# Patient Record
Sex: Female | Born: 1985
Health system: Southern US, Community
[De-identification: ages and names within clinical notes are randomized; demographics above are authoritative.]

## PROBLEM LIST (undated history)

## (undated) ENCOUNTER — Inpatient Hospital Stay (HOSPITAL_COMMUNITY): Payer: Self-pay

## (undated) DIAGNOSIS — Z789 Other specified health status: Secondary | ICD-10-CM

## (undated) DIAGNOSIS — D649 Anemia, unspecified: Secondary | ICD-10-CM

## (undated) DIAGNOSIS — B029 Zoster without complications: Secondary | ICD-10-CM

## (undated) DIAGNOSIS — Z8619 Personal history of other infectious and parasitic diseases: Secondary | ICD-10-CM

## (undated) HISTORY — DX: Anemia, unspecified: D64.9

## (undated) HISTORY — PX: NO PAST SURGERIES: SHX2092

## (undated) HISTORY — PX: UMBILICAL HERNIA REPAIR: SHX196

## (undated) HISTORY — DX: Personal history of other infectious and parasitic diseases: Z86.19

---

## 2004-05-28 ENCOUNTER — Emergency Department (HOSPITAL_COMMUNITY): Admission: EM | Admit: 2004-05-28 | Discharge: 2004-05-29 | Payer: Self-pay | Admitting: *Deleted

## 2005-12-13 ENCOUNTER — Emergency Department (HOSPITAL_COMMUNITY): Admission: EM | Admit: 2005-12-13 | Discharge: 2005-12-13 | Payer: Self-pay | Admitting: Family Medicine

## 2007-03-26 ENCOUNTER — Emergency Department (HOSPITAL_COMMUNITY): Admission: EM | Admit: 2007-03-26 | Discharge: 2007-03-26 | Payer: Self-pay | Admitting: Family Medicine

## 2010-10-07 LAB — HIV ANTIBODY (ROUTINE TESTING W REFLEX): HIV: NONREACTIVE

## 2010-10-07 LAB — HEPATITIS B SURFACE ANTIGEN: Hepatitis B Surface Ag: NEGATIVE

## 2010-10-07 LAB — RPR
RPR: NONREACTIVE
RPR: NONREACTIVE
RPR: NONREACTIVE

## 2010-10-07 LAB — RUBELLA ANTIBODY, IGM: Rubella: IMMUNE

## 2010-10-07 LAB — ABO/RH: RH Type: POSITIVE

## 2010-10-07 LAB — ANTIBODY SCREEN: Antibody Screen: NEGATIVE

## 2010-10-20 LAB — GC/CHLAMYDIA PROBE AMP, GENITAL
Chlamydia: NEGATIVE
Chlamydia: NEGATIVE
Chlamydia: NEGATIVE
Gonorrhea: NEGATIVE
Gonorrhea: NEGATIVE
Gonorrhea: NEGATIVE

## 2011-03-23 ENCOUNTER — Inpatient Hospital Stay (HOSPITAL_COMMUNITY): Admit: 2011-03-23 | Payer: Self-pay | Admitting: Obstetrics and Gynecology

## 2011-04-17 LAB — STREP B DNA PROBE: GBS: NEGATIVE

## 2011-05-05 ENCOUNTER — Inpatient Hospital Stay (HOSPITAL_COMMUNITY)
Admission: AD | Admit: 2011-05-05 | Discharge: 2011-05-09 | DRG: 775 | Disposition: A | Discharge status: Home / Self Care | Payer: 59 | Source: Ambulatory Visit | Attending: Obstetrics and Gynecology | Admitting: Obstetrics and Gynecology

## 2011-05-05 ENCOUNTER — Encounter (HOSPITAL_COMMUNITY): Payer: Self-pay | Admitting: *Deleted

## 2011-05-05 HISTORY — DX: Other specified health status: Z78.9

## 2011-05-05 LAB — CBC
HCT: 32.7 % — ABNORMAL LOW (ref 36.0–46.0)
Hemoglobin: 11 g/dL — ABNORMAL LOW (ref 12.0–15.0)
MCH: 30.2 pg (ref 26.0–34.0)
MCHC: 33.6 g/dL (ref 30.0–36.0)
MCV: 89.8 fL (ref 78.0–100.0)
Platelets: 264 10*3/uL (ref 150–400)
RBC: 3.64 MIL/uL — ABNORMAL LOW (ref 3.87–5.11)
RDW: 13.2 % (ref 11.5–15.5)
WBC: 12 10*3/uL — ABNORMAL HIGH (ref 4.0–10.5)

## 2011-05-05 LAB — RPR: RPR Ser Ql: NONREACTIVE

## 2011-05-05 MED ORDER — SODIUM CHLORIDE 0.9 % IV SOLN
2.0000 g | Freq: Four times a day (QID) | INTRAVENOUS | Status: DC
  Administered 2011-05-05 – 2011-05-06 (×5): 2 g via INTRAVENOUS
  Filled 2011-05-05 (×7): qty 2000

## 2011-05-05 MED ORDER — DIPHENHYDRAMINE HCL 50 MG/ML IJ SOLN: 12.5000 mg | INTRAMUSCULAR | Status: DC | PRN

## 2011-05-05 MED ORDER — LIDOCAINE HCL (PF) 1 % IJ SOLN
30.0000 mL | INTRAMUSCULAR | Status: DC | PRN
Start: 2011-05-05 — End: 2011-05-07
  Administered 2011-05-07: 30 mL via SUBCUTANEOUS
  Filled 2011-05-05: qty 30

## 2011-05-05 MED ORDER — PHENYLEPHRINE 40 MCG/ML (10ML) SYRINGE FOR IV PUSH (FOR BLOOD PRESSURE SUPPORT)
80.0000 ug | PREFILLED_SYRINGE | INTRAVENOUS | Status: DC | PRN
  Filled 2011-05-05: qty 5

## 2011-05-05 MED ORDER — OXYTOCIN BOLUS FROM INFUSION
500.0000 mL | Freq: Once | INTRAVENOUS | Status: DC
  Filled 2011-05-05: qty 1000
  Filled 2011-05-05: qty 500

## 2011-05-05 MED ORDER — ZOLPIDEM TARTRATE 10 MG PO TABS
10.0000 mg | ORAL_TABLET | Freq: Every evening | ORAL | Status: DC | PRN
  Administered 2011-05-05: 10 mg via ORAL
  Filled 2011-05-05: qty 1

## 2011-05-05 MED ORDER — EPHEDRINE 5 MG/ML INJ
10.0000 mg | INTRAVENOUS | Status: DC | PRN
  Filled 2011-05-05: qty 4

## 2011-05-05 MED ORDER — LACTATED RINGERS IV SOLN
INTRAVENOUS | Status: DC
  Administered 2011-05-05 (×2): via INTRAVENOUS

## 2011-05-05 MED ORDER — OXYTOCIN 20 UNITS IN LACTATED RINGERS INFUSION - SIMPLE
1.0000 m[IU]/min | INTRAVENOUS | Status: DC
  Administered 2011-05-05: 1 m[IU]/min via INTRAVENOUS

## 2011-05-05 MED ORDER — OXYCODONE-ACETAMINOPHEN 5-325 MG PO TABS: 1.0000 | ORAL_TABLET | ORAL | Status: DC | PRN

## 2011-05-05 MED ORDER — PHENYLEPHRINE 40 MCG/ML (10ML) SYRINGE FOR IV PUSH (FOR BLOOD PRESSURE SUPPORT): 80.0000 ug | PREFILLED_SYRINGE | INTRAVENOUS | Status: DC | PRN

## 2011-05-05 MED ORDER — OXYTOCIN 20 UNITS IN LACTATED RINGERS INFUSION - SIMPLE: 125.0000 mL/h | Freq: Once | INTRAVENOUS | Status: DC

## 2011-05-05 MED ORDER — ONDANSETRON HCL 4 MG/2ML IJ SOLN: 4.0000 mg | Freq: Four times a day (QID) | INTRAMUSCULAR | Status: DC | PRN

## 2011-05-05 MED ORDER — LACTATED RINGERS IV SOLN: 500.0000 mL | INTRAVENOUS | Status: DC | PRN

## 2011-05-05 MED ORDER — EPHEDRINE 5 MG/ML INJ: 10.0000 mg | INTRAVENOUS | Status: DC | PRN

## 2011-05-05 MED ORDER — TERBUTALINE SULFATE 1 MG/ML IJ SOLN: 0.2500 mg | Freq: Once | INTRAMUSCULAR | Status: AC | PRN

## 2011-05-05 MED ORDER — ACETAMINOPHEN 325 MG PO TABS: 650.0000 mg | ORAL_TABLET | ORAL | Status: DC | PRN

## 2011-05-05 MED ORDER — IBUPROFEN 600 MG PO TABS: 600.0000 mg | ORAL_TABLET | Freq: Four times a day (QID) | ORAL | Status: DC | PRN

## 2011-05-05 MED ORDER — LACTATED RINGERS IV SOLN
500.0000 mL | Freq: Once | INTRAVENOUS | Status: AC
  Administered 2011-05-06: 500 mL via INTRAVENOUS

## 2011-05-05 MED ORDER — FENTANYL 2.5 MCG/ML BUPIVACAINE 1/10 % EPIDURAL INFUSION (WH - ANES)
14.0000 mL/h | INTRAMUSCULAR | Status: DC
  Administered 2011-05-06 (×2): 14 mL/h via EPIDURAL
  Filled 2011-05-05 (×3): qty 60

## 2011-05-05 MED ORDER — CITRIC ACID-SODIUM CITRATE 334-500 MG/5ML PO SOLN: 30.0000 mL | ORAL | Status: DC | PRN

## 2011-05-05 MED ORDER — FLEET ENEMA 7-19 GM/118ML RE ENEM: 1.0000 | ENEMA | RECTAL | Status: DC | PRN

## 2011-05-05 NOTE — H&P (Signed)
Anita Wiggins is a 26 y.o. female presenting for admission due to SROM bloody fluid in office this morning after exam.  Nitrazine postive and gush of fluid noted.Marland Kitchen History OB History    Grav Para Term Preterm Abortions TAB SAB Ect Mult Living   1              No past medical history on file. History reviewed. No pertinent past surgical history. Family History: family history is not on file. Social History:  does not have a smoking history on file. She does not have any smokeless tobacco history on file. She reports that she does not drink alcohol. Her drug history not on file.  ROS    Blood pressure 133/73, pulse 92, temperature 98.5 F (36.9 C), temperature source Oral, resp. rate 18, height 5\' 8"  (1.727 m), weight 74.844 kg (165 lb). Exam Physical Exam  2/50/-2 Cervix posterior Nitrazine positve, pool positive Prenatal labs: ABO, Rh: O/Positive/-- (07/17 0000) Antibody: Negative (07/17 0000) Rubella: Immune (07/17 0000) RPR: Nonreactive, Nonreactive, Nonreactive (07/17 0000)  HBsAg: Negative (07/17 0000)  HIV: Non-reactive (07/17 0000)  GBS: Negative (01/25 0000)   Assessment/Plan: IUP at term with leaking (SROM) Plan admission with augmentation of labor as needed. Anticipate SVD   Loria Lacina C 05/05/2011, 8:31 PM

## 2011-05-06 ENCOUNTER — Encounter (HOSPITAL_COMMUNITY): Payer: Self-pay | Admitting: *Deleted

## 2011-05-06 ENCOUNTER — Encounter (HOSPITAL_COMMUNITY): Payer: Self-pay | Admitting: Anesthesiology

## 2011-05-06 ENCOUNTER — Inpatient Hospital Stay (HOSPITAL_COMMUNITY): Payer: 59 | Admitting: Anesthesiology

## 2011-05-06 MED ORDER — OXYTOCIN 20 UNITS IN LACTATED RINGERS INFUSION - SIMPLE
1.0000 m[IU]/min | INTRAVENOUS | Status: DC
  Administered 2011-05-06: 3 m[IU]/min via INTRAVENOUS

## 2011-05-06 MED ORDER — BUTORPHANOL TARTRATE 2 MG/ML IJ SOLN
1.0000 mg | INTRAMUSCULAR | Status: DC | PRN
  Administered 2011-05-06: 1 mg via INTRAVENOUS
  Administered 2011-05-06: 14:00:00 via INTRAVENOUS
  Filled 2011-05-06 (×2): qty 1

## 2011-05-06 MED ORDER — LIDOCAINE HCL 1.5 % IJ SOLN
INTRAMUSCULAR | Status: DC | PRN
  Administered 2011-05-06 (×2): 5 mL via EPIDURAL

## 2011-05-06 MED ORDER — TERBUTALINE SULFATE 1 MG/ML IJ SOLN: 0.2500 mg | Freq: Once | INTRAMUSCULAR | Status: AC | PRN

## 2011-05-06 MED ORDER — FENTANYL 2.5 MCG/ML BUPIVACAINE 1/10 % EPIDURAL INFUSION (WH - ANES)
INTRAMUSCULAR | Status: DC | PRN
  Administered 2011-05-06: 14 mL/h via EPIDURAL

## 2011-05-06 NOTE — Progress Notes (Signed)
UCs were q1-2 min after AROM Pitocin then discontinued Now UCs about q2-3 min FHT remained reactive Will restart pitocin if UCs space out and no cervical change Epidural prn

## 2011-05-06 NOTE — Progress Notes (Signed)
UR chart review completed.  

## 2011-05-06 NOTE — Anesthesia Preprocedure Evaluation (Signed)

## 2011-05-06 NOTE — Anesthesia Procedure Notes (Signed)
Epidural Patient location during procedure: OB Start time: 05/06/2011 3:29 PM End time: 05/06/2011 3:35 PM Reason for block: procedure for pain  Staffing Anesthesiologist: Sandrea Hughs Performed by: anesthesiologist   Preanesthetic Checklist Completed: patient identified, site marked, surgical consent, pre-op evaluation, timeout performed, IV checked, risks and benefits discussed and monitors and equipment checked  Epidural Patient position: sitting Prep: site prepped and draped and DuraPrep Patient monitoring: continuous pulse ox and blood pressure Approach: midline Injection technique: LOR air  Needle:  Needle type: Tuohy  Needle gauge: 17 G Needle length: 9 cm Needle insertion depth: 5 cm cm Catheter type: closed end flexible Catheter size: 19 Gauge Catheter at skin depth: 10 cm Test dose: negative and 1.5% lidocaine  Assessment Sensory level: T8 Events: blood not aspirated, injection not painful, no injection resistance, negative IV test and no paresthesia

## 2011-05-06 NOTE — Progress Notes (Signed)
Cx no change FHT reactive UCs q 3-3.5 min, 45-50  D/W pt, will optimize labor with pitocin, follow labor pattern and cervix closely

## 2011-05-06 NOTE — Progress Notes (Signed)
Epidural in cx  4/c/-1 FHT reactive, some variable decels uc's about q 3-4 min IUPC placed

## 2011-05-06 NOTE — Progress Notes (Signed)
UCs q2-3 min FHT reactive Cx 2/C/O Stadol/epidural prn, D/W pt

## 2011-05-06 NOTE — Progress Notes (Signed)
Cx C/C +1/LOT FHT reactive UCs q 2-3 min Begin second stage

## 2011-05-06 NOTE — Progress Notes (Signed)
Starting to feel strong UCs FHT reactive, + accels UCs q 2-3 min Cx 2/80/-2/vtx AROM of forebag , clear Pit on  Epidural prn

## 2011-05-06 NOTE — Progress Notes (Signed)
Cx 7.5 (8 with UC)/C/0 FHT reactive  Temp 100 po, now 99.3

## 2011-05-07 ENCOUNTER — Encounter (HOSPITAL_COMMUNITY): Payer: Self-pay | Admitting: *Deleted

## 2011-05-07 ENCOUNTER — Other Ambulatory Visit: Payer: Self-pay | Admitting: Obstetrics and Gynecology

## 2011-05-07 MED ORDER — BENZOCAINE-MENTHOL 20-0.5 % EX AERO
1.0000 "application " | INHALATION_SPRAY | CUTANEOUS | Status: DC | PRN
  Filled 2011-05-07: qty 56

## 2011-05-07 MED ORDER — BENZOCAINE-MENTHOL 20-0.5 % EX AERO
INHALATION_SPRAY | CUTANEOUS | Status: AC
  Filled 2011-05-07: qty 56

## 2011-05-07 MED ORDER — DIPHENHYDRAMINE HCL 25 MG PO CAPS: 25.0000 mg | ORAL_CAPSULE | Freq: Four times a day (QID) | ORAL | Status: DC | PRN

## 2011-05-07 MED ORDER — OXYCODONE-ACETAMINOPHEN 5-325 MG PO TABS
1.0000 | ORAL_TABLET | ORAL | Status: DC | PRN
  Administered 2011-05-07 (×4): 1 via ORAL
  Filled 2011-05-07 (×4): qty 1

## 2011-05-07 MED ORDER — SIMETHICONE 80 MG PO CHEW: 80.0000 mg | CHEWABLE_TABLET | ORAL | Status: DC | PRN

## 2011-05-07 MED ORDER — ZOLPIDEM TARTRATE 5 MG PO TABS: 5.0000 mg | ORAL_TABLET | Freq: Every evening | ORAL | Status: DC | PRN

## 2011-05-07 MED ORDER — ONDANSETRON HCL 4 MG PO TABS: 4.0000 mg | ORAL_TABLET | ORAL | Status: DC | PRN

## 2011-05-07 MED ORDER — BISACODYL 10 MG RE SUPP
10.0000 mg | Freq: Every day | RECTAL | Status: DC | PRN
  Filled 2011-05-07: qty 1

## 2011-05-07 MED ORDER — TETANUS-DIPHTH-ACELL PERTUSSIS 5-2.5-18.5 LF-MCG/0.5 IM SUSP: 0.5000 mL | Freq: Once | INTRAMUSCULAR | Status: DC

## 2011-05-07 MED ORDER — IBUPROFEN 600 MG PO TABS
600.0000 mg | ORAL_TABLET | Freq: Four times a day (QID) | ORAL | Status: DC
  Administered 2011-05-07 – 2011-05-09 (×9): 600 mg via ORAL
  Filled 2011-05-07 (×10): qty 1

## 2011-05-07 MED ORDER — DIBUCAINE 1 % RE OINT
1.0000 "application " | TOPICAL_OINTMENT | RECTAL | Status: DC | PRN
  Filled 2011-05-07: qty 28

## 2011-05-07 MED ORDER — LANOLIN HYDROUS EX OINT: TOPICAL_OINTMENT | CUTANEOUS | Status: DC | PRN

## 2011-05-07 MED ORDER — CEFAZOLIN SODIUM 1-5 GM-% IV SOLN
1.0000 g | Freq: Four times a day (QID) | INTRAVENOUS | Status: AC
  Administered 2011-05-07 (×2): 1 g via INTRAVENOUS
  Filled 2011-05-07 (×2): qty 50

## 2011-05-07 MED ORDER — ONDANSETRON HCL 4 MG/2ML IJ SOLN: 4.0000 mg | INTRAMUSCULAR | Status: DC | PRN

## 2011-05-07 MED ORDER — SENNOSIDES-DOCUSATE SODIUM 8.6-50 MG PO TABS
2.0000 | ORAL_TABLET | Freq: Every day | ORAL | Status: DC
  Administered 2011-05-07: 2 via ORAL

## 2011-05-07 MED ORDER — PRENATAL MULTIVITAMIN CH
1.0000 | ORAL_TABLET | Freq: Every day | ORAL | Status: DC
  Administered 2011-05-07 – 2011-05-09 (×3): 1 via ORAL
  Filled 2011-05-07 (×3): qty 1

## 2011-05-07 MED ORDER — FLEET ENEMA 7-19 GM/118ML RE ENEM: 1.0000 | ENEMA | Freq: Every day | RECTAL | Status: DC | PRN

## 2011-05-07 MED ORDER — WITCH HAZEL-GLYCERIN EX PADS: 1.0000 "application " | MEDICATED_PAD | CUTANEOUS | Status: DC | PRN

## 2011-05-07 NOTE — Anesthesia Postprocedure Evaluation (Signed)
  Anesthesia Post-op Note  Patient: Anita Wiggins  Procedure(s) Performed: * No procedures listed *  Patient Location: Mother/Baby  Anesthesia Type: Epidural  Level of Consciousness: awake  Airway and Oxygen Therapy: Patient Spontanous Breathing  Post-op Pain: none  Post-op Assessment: Patient's Cardiovascular Status Stable, Respiratory Function Stable, No signs of Nausea or vomiting, Adequate PO intake and Pain level controlled  Post-op Vital Signs: Reviewed and stable  Complications: No apparent anesthesia complications

## 2011-05-07 NOTE — Progress Notes (Signed)
Post Partum Day 0 Subjective: up ad lib, voiding and tolerating PO  Objective: Blood pressure 104/59, pulse 102, temperature 98.9 F (37.2 C), temperature source Axillary, resp. rate 18, height 5\' 8"  (1.727 m), weight 74.844 kg (165 lb), SpO2 97.00%, unknown if currently breastfeeding.  Physical Exam:  General: alert and cooperative Lochia: appropriate Uterine Fundus: firm Perineum intact, labial edema noted DVT Evaluation: No evidence of DVT seen on physical exam.   Basename 05/05/11 1145  HGB 11.0*  HCT 32.7*    Assessment/Plan: Plan for discharge tomorrow Continue ice pack to perineum   LOS: 2 days   Brit Wernette G 05/07/2011, 8:02 AM

## 2011-05-07 NOTE — Anesthesia Postprocedure Evaluation (Signed)
Anesthesia Post Note  Patient: Anita Wiggins  Procedure(s) Performed: * No procedures listed *  Anesthesia type: Epidural  Patient location: Mother/Baby  Post pain: Pain level controlled  Post assessment: Post-op Vital signs reviewed  Last Vitals:  Filed Vitals:   05/07/11 0440  BP: 104/59  Pulse: 102  Temp: 37.2 C  Resp: 18    Post vital signs: Reviewed  Level of consciousness: awake  Complications: No apparent anesthesia complications

## 2011-05-07 NOTE — Progress Notes (Signed)
Delivery Note Pushing x 3 hours, vtx at +3, pt exhausted D/W pt/husband options, d/w VE and risks of severe morbidity/mortality Straight cath of bladder Kiwi VE applied, steady progress over about 4 UCs, one pop off, LOA, asynclytic Second degree MLE done secondary to tight perineum, repaired Placenta 3 vessels, intact, EBL 350cc Cx/vagina intact VMI  Apgars 9/9, weight 7#  3oz, art pH 7.23 Pt/infant stable in LDR

## 2011-05-07 NOTE — Addendum Note (Signed)
Addendum  created 05/07/11 1006 by Suella Grove, CRNA   Modules edited:Charges VN, Notes Section

## 2011-05-07 NOTE — Addendum Note (Signed)
Addendum  created 05/07/11 1006 by Jafari Mckillop C Kimsey Demaree, CRNA   Modules edited:Charges VN, Notes Section    

## 2011-05-08 LAB — CBC
HCT: 26.7 % — ABNORMAL LOW (ref 36.0–46.0)
Hemoglobin: 8.7 g/dL — ABNORMAL LOW (ref 12.0–15.0)
MCH: 29.6 pg (ref 26.0–34.0)
MCHC: 32.6 g/dL (ref 30.0–36.0)
MCV: 90.8 fL (ref 78.0–100.0)
Platelets: 207 10*3/uL (ref 150–400)
RBC: 2.94 MIL/uL — ABNORMAL LOW (ref 3.87–5.11)
RDW: 13.6 % (ref 11.5–15.5)
WBC: 12.8 10*3/uL — ABNORMAL HIGH (ref 4.0–10.5)

## 2011-05-08 MED ORDER — IBUPROFEN 600 MG PO TABS: 600.0000 mg | ORAL_TABLET | Freq: Four times a day (QID) | ORAL | Status: AC

## 2011-05-08 MED ORDER — OXYCODONE-ACETAMINOPHEN 5-325 MG PO TABS: 1.0000 | ORAL_TABLET | ORAL | Status: AC | PRN

## 2011-05-08 NOTE — Progress Notes (Signed)
Post Partum Day 1 Subjective: no complaints, up ad lib, voiding, tolerating PO and + flatus  Objective: Blood pressure 135/83, pulse 82, temperature 98 F (36.7 C), temperature source Oral, resp. rate 18, height 5\' 8"  (1.727 m), weight 74.844 kg (165 lb), SpO2 97.00%, unknown if currently breastfeeding.  Physical Exam:  General: alert and cooperative Lochia: appropriate Uterine Fundus: firm Perineum intact DVT Evaluation: No evidence of DVT seen on physical exam.   Basename 05/08/11 0558 05/05/11 1145  HGB 8.7* 11.0*  HCT 26.7* 32.7*    Assessment/Plan: Discharge home   LOS: 3 days   Travonna Swindle G 05/08/2011, 8:00 AM

## 2011-05-08 NOTE — Progress Notes (Signed)
Patient wanted an early discharge.  Discharged cancelled due to baby not being discharged by pediatrician.  Prescriptions for ibuprofin and oxycodone given to patient

## 2011-05-09 NOTE — Progress Notes (Signed)
Post Partum Day 2 Subjective: no complaints  Objective: Blood pressure 112/67, pulse 81, temperature 98.5 F (36.9 C), temperature source Oral, resp. rate 18, height 5\' 8"  (1.727 m), weight 165 lb (74.844 kg), SpO2 97.00%, unknown if currently breastfeeding.  Physical Exam:  General: alert Lochia: appropriate Uterine Fundus: firm Incision: healing well DVT Evaluation: No evidence of DVT seen on physical exam.   Basename 05/08/11 0558  HGB 8.7*  HCT 26.7*    Assessment/Plan: Discharge home   LOS: 4 days   Anita Wiggins M 05/09/2011, 9:27 AM

## 2011-06-17 ENCOUNTER — Other Ambulatory Visit: Payer: Self-pay | Admitting: Obstetrics and Gynecology

## 2012-07-01 ENCOUNTER — Other Ambulatory Visit: Payer: Self-pay | Admitting: Obstetrics and Gynecology

## 2013-07-11 ENCOUNTER — Encounter (HOSPITAL_COMMUNITY): Payer: Self-pay

## 2013-07-11 ENCOUNTER — Inpatient Hospital Stay (HOSPITAL_COMMUNITY)
Admission: AD | Admit: 2013-07-11 | Discharge: 2013-07-11 | Disposition: A | Discharge status: Home / Self Care | Payer: 59 | Source: Ambulatory Visit | Attending: Obstetrics and Gynecology | Admitting: Obstetrics and Gynecology

## 2013-07-11 DIAGNOSIS — O00109 Unspecified tubal pregnancy without intrauterine pregnancy: Secondary | ICD-10-CM | POA: Insufficient documentation

## 2013-07-11 HISTORY — DX: Zoster without complications: B02.9

## 2013-07-11 LAB — CBC WITH DIFFERENTIAL/PLATELET
Basophils Absolute: 0.1 10*3/uL (ref 0.0–0.1)
Basophils Relative: 0 % (ref 0–1)
Eosinophils Absolute: 0.2 10*3/uL (ref 0.0–0.7)
Eosinophils Relative: 2 % (ref 0–5)
HCT: 39 % (ref 36.0–46.0)
Hemoglobin: 13.4 g/dL (ref 12.0–15.0)
Lymphocytes Relative: 13 % (ref 12–46)
Lymphs Abs: 1.7 10*3/uL (ref 0.7–4.0)
MCH: 30 pg (ref 26.0–34.0)
MCHC: 34.4 g/dL (ref 30.0–36.0)
MCV: 87.4 fL (ref 78.0–100.0)
Monocytes Absolute: 0.8 10*3/uL (ref 0.1–1.0)
Monocytes Relative: 6 % (ref 3–12)
Neutro Abs: 10.7 10*3/uL — ABNORMAL HIGH (ref 1.7–7.7)
Neutrophils Relative %: 79 % — ABNORMAL HIGH (ref 43–77)
Platelets: 276 10*3/uL (ref 150–400)
RBC: 4.46 MIL/uL (ref 3.87–5.11)
RDW: 13.3 % (ref 11.5–15.5)
WBC: 13.5 10*3/uL — ABNORMAL HIGH (ref 4.0–10.5)

## 2013-07-11 LAB — AST: AST: 12 U/L (ref 0–37)

## 2013-07-11 LAB — CREATININE, SERUM
Creatinine, Ser: 0.54 mg/dL (ref 0.50–1.10)
GFR calc Af Amer: >90 mL/min (ref >90)
GFR calc non Af Amer: >90 mL/min (ref >90)

## 2013-07-11 LAB — BUN: BUN: 6 mg/dL (ref 6–23)

## 2013-07-11 LAB — HCG, QUANTITATIVE, PREGNANCY: hCG, Beta Chain, Quant, S: 6242 m[IU]/mL — ABNORMAL HIGH (ref <5)

## 2013-07-11 MED ORDER — METHOTREXATE INJECTION FOR WOMEN'S HOSPITAL
50.0000 mg/m2 | Freq: Once | INTRAMUSCULAR | Status: AC
  Administered 2013-07-11: 85 mg via INTRAMUSCULAR
  Filled 2013-07-11: qty 1.7

## 2013-07-11 NOTE — Discharge Instructions (Signed)
Keep your scheduled appointment for F/U labs and any other appointments with your physician. Call the MD office or provider on call with any concerns or return to MAU.

## 2013-07-11 NOTE — MAU Note (Signed)
Patient states she has been diagnosed with an ectopic pregnancy and sent from the office for MTX workup and injection. Patient states she is having bleeding and cramping.

## 2013-07-11 NOTE — H&P (Signed)
28 year old G 2 P 2 with right adnexal mass. Quants abnormally rising and today ultrasound revealed 15 mm right adnexal mass.  Complain of some cramping and bleeding  Afebrile VSS Abdomen is benign  IMPRESSION: Right ectopic pregnancy PLAN: Methotrexate injection today Patient counseled about risks of the medication Return to office on Friday for [place] with me

## 2013-07-14 ENCOUNTER — Inpatient Hospital Stay (HOSPITAL_COMMUNITY)
Admission: AD | Admit: 2013-07-14 | Discharge: 2013-07-14 | Disposition: A | Discharge status: Home / Self Care | Payer: 59 | Source: Ambulatory Visit | Attending: Obstetrics and Gynecology | Admitting: Obstetrics and Gynecology

## 2013-07-14 DIAGNOSIS — O00109 Unspecified tubal pregnancy without intrauterine pregnancy: Secondary | ICD-10-CM | POA: Insufficient documentation

## 2013-07-14 MED ORDER — METHOTREXATE INJECTION FOR WOMEN'S HOSPITAL
50.0000 mg/m2 | Freq: Once | INTRAMUSCULAR | Status: AC
  Administered 2013-07-14: 85 mg via INTRAMUSCULAR
  Filled 2013-07-14: qty 1.7

## 2013-07-14 NOTE — MAU Note (Signed)
Went to office for day 4 post MTX.  Was called with results. HCG almost doubled. Sent in for 2nd dose.  Has had occ twinges, continues to bleed, looks like mid cylce bleeding now. Had fleshy like clots on Tues.Wed.

## 2013-07-15 ENCOUNTER — Inpatient Hospital Stay (HOSPITAL_COMMUNITY): Payer: 59

## 2013-07-15 ENCOUNTER — Encounter (HOSPITAL_COMMUNITY): Payer: Self-pay | Admitting: *Deleted

## 2013-07-15 ENCOUNTER — Observation Stay (HOSPITAL_COMMUNITY)
Admission: AD | Admit: 2013-07-15 | Discharge: 2013-07-15 | Disposition: A | Discharge status: Home / Self Care | Payer: 59 | Source: Ambulatory Visit | Attending: Obstetrics and Gynecology | Admitting: Obstetrics and Gynecology

## 2013-07-15 DIAGNOSIS — R1031 Right lower quadrant pain: Secondary | ICD-10-CM | POA: Insufficient documentation

## 2013-07-15 DIAGNOSIS — O00109 Unspecified tubal pregnancy without intrauterine pregnancy: Principal | ICD-10-CM | POA: Insufficient documentation

## 2013-07-15 DIAGNOSIS — O009 Unspecified ectopic pregnancy without intrauterine pregnancy: Secondary | ICD-10-CM | POA: Diagnosis present

## 2013-07-15 LAB — CBC
HCT: 38.2 % (ref 36.0–46.0)
HCT: 34.2 % — ABNORMAL LOW (ref 36.0–46.0)
HCT: 35.6 % — ABNORMAL LOW (ref 36.0–46.0)
Hemoglobin: 13.3 g/dL (ref 12.0–15.0)
Hemoglobin: 11.8 g/dL — ABNORMAL LOW (ref 12.0–15.0)
Hemoglobin: 12.1 g/dL (ref 12.0–15.0)
MCH: 30.3 pg (ref 26.0–34.0)
MCH: 29.9 pg (ref 26.0–34.0)
MCH: 29.5 pg (ref 26.0–34.0)
MCHC: 34.8 g/dL (ref 30.0–36.0)
MCHC: 34.5 g/dL (ref 30.0–36.0)
MCHC: 34 g/dL (ref 30.0–36.0)
MCV: 87 fL (ref 78.0–100.0)
MCV: 86.6 fL (ref 78.0–100.0)
MCV: 86.8 fL (ref 78.0–100.0)
Platelets: 299 10*3/uL (ref 150–400)
Platelets: 256 10*3/uL (ref 150–400)
Platelets: 259 10*3/uL (ref 150–400)
RBC: 4.39 MIL/uL (ref 3.87–5.11)
RBC: 3.95 MIL/uL (ref 3.87–5.11)
RBC: 4.1 MIL/uL (ref 3.87–5.11)
RDW: 12.9 % (ref 11.5–15.5)
RDW: 12.9 % (ref 11.5–15.5)
RDW: 13 % (ref 11.5–15.5)
WBC: 12.9 10*3/uL — ABNORMAL HIGH (ref 4.0–10.5)
WBC: 8.6 10*3/uL (ref 4.0–10.5)
WBC: 8.8 10*3/uL (ref 4.0–10.5)

## 2013-07-15 LAB — HCG, QUANTITATIVE, PREGNANCY: hCG, Beta Chain, Quant, S: 8120 m[IU]/mL — ABNORMAL HIGH (ref <5)

## 2013-07-15 MED ORDER — LACTATED RINGERS IV SOLN
INTRAVENOUS | Status: DC
  Administered 2013-07-15 (×2): via INTRAVENOUS

## 2013-07-15 MED ORDER — ONDANSETRON HCL 4 MG PO TABS: 4.0000 mg | ORAL_TABLET | Freq: Four times a day (QID) | ORAL | Status: DC | PRN

## 2013-07-15 MED ORDER — BISACODYL 10 MG RE SUPP
10.0000 mg | Freq: Every day | RECTAL | Status: DC | PRN
  Administered 2013-07-15: 10 mg via RECTAL
  Filled 2013-07-15: qty 1

## 2013-07-15 MED ORDER — PRENATAL MULTIVITAMIN CH: 1.0000 | ORAL_TABLET | Freq: Every day | ORAL | Status: DC

## 2013-07-15 MED ORDER — ONDANSETRON HCL 4 MG/2ML IJ SOLN: 4.0000 mg | Freq: Four times a day (QID) | INTRAMUSCULAR | Status: DC | PRN

## 2013-07-15 MED ORDER — SIMETHICONE 80 MG PO CHEW
80.0000 mg | CHEWABLE_TABLET | ORAL | Status: AC
  Administered 2013-07-15: 80 mg via ORAL
  Filled 2013-07-15: qty 1

## 2013-07-15 NOTE — Progress Notes (Signed)
Ambulating to BR without difficulty Pain decreased  VSS Afeb abd flat soft, mild RLQ tend without rebound or masses  Results for orders placed during the hospital encounter of 07/15/13 (from the past 24 hour(s))  HCG, QUANTITATIVE, PREGNANCY     Status: Abnormal   Collection Time    07/15/13  2:48 AM      Result Value Ref Range   hCG, Beta Chain, Quant, S 8120 (*) <5 mIU/mL  CBC     Status: Abnormal   Collection Time    07/15/13  4:28 AM      Result Value Ref Range   WBC 12.9 (*) 4.0 - 10.5 K/uL   RBC 4.39  3.87 - 5.11 MIL/uL   Hemoglobin 13.3  12.0 - 15.0 g/dL   HCT 38.2  36.0 - 46.0 %   MCV 87.0  78.0 - 100.0 fL   MCH 30.3  26.0 - 34.0 pg   MCHC 34.8  30.0 - 36.0 g/dL   RDW 12.9  11.5 - 15.5 %   Platelets 299  150 - 400 K/uL  CBC     Status: Abnormal   Collection Time    07/15/13 11:51 AM      Result Value Ref Range   WBC 8.6  4.0 - 10.5 K/uL   RBC 3.95  3.87 - 5.11 MIL/uL   Hemoglobin 11.8 (*) 12.0 - 15.0 g/dL   HCT 34.2 (*) 36.0 - 46.0 %   MCV 86.6  78.0 - 100.0 fL   MCH 29.9  26.0 - 34.0 pg   MCHC 34.5  30.0 - 36.0 g/dL   RDW 12.9  11.5 - 15.5 %   Platelets 256  150 - 400 K/uL    A/P: D/W patient and husband drop in Hgb         Check CBC at 4 pm , if stable consider D/C home

## 2013-07-15 NOTE — Progress Notes (Signed)
Feels same  Vss Afeb  Abd soft, flat  Results for orders placed during the hospital encounter of 07/15/13 (from the past 24 hour(s))  HCG, QUANTITATIVE, PREGNANCY     Status: Abnormal   Collection Time    07/15/13  2:48 AM      Result Value Ref Range   hCG, Beta Chain, Quant, S 8120 (*) <5 mIU/mL  CBC     Status: Abnormal   Collection Time    07/15/13  4:28 AM      Result Value Ref Range   WBC 12.9 (*) 4.0 - 10.5 K/uL   RBC 4.39  3.87 - 5.11 MIL/uL   Hemoglobin 13.3  12.0 - 15.0 g/dL   HCT 38.2  36.0 - 46.0 %   MCV 87.0  78.0 - 100.0 fL   MCH 30.3  26.0 - 34.0 pg   MCHC 34.8  30.0 - 36.0 g/dL   RDW 12.9  11.5 - 15.5 %   Platelets 299  150 - 400 K/uL  CBC     Status: Abnormal   Collection Time    07/15/13 11:51 AM      Result Value Ref Range   WBC 8.6  4.0 - 10.5 K/uL   RBC 3.95  3.87 - 5.11 MIL/uL   Hemoglobin 11.8 (*) 12.0 - 15.0 g/dL   HCT 34.2 (*) 36.0 - 46.0 %   MCV 86.6  78.0 - 100.0 fL   MCH 29.9  26.0 - 34.0 pg   MCHC 34.5  30.0 - 36.0 g/dL   RDW 12.9  11.5 - 15.5 %   Platelets 256  150 - 400 K/uL  CBC     Status: Abnormal   Collection Time    07/15/13  3:51 PM      Result Value Ref Range   WBC 8.8  4.0 - 10.5 K/uL   RBC 4.10  3.87 - 5.11 MIL/uL   Hemoglobin 12.1  12.0 - 15.0 g/dL   HCT 35.6 (*) 36.0 - 46.0 %   MCV 86.8  78.0 - 100.0 fL   MCH 29.5  26.0 - 34.0 pg   MCHC 34.0  30.0 - 36.0 g/dL   RDW 13.0  11.5 - 15.5 %   Platelets 259  150 - 400 K/uL    A: Right ectopic pregnancy with probable expulsion of pregnancy  P: D/C home      Rest, tylenol prn, No NSAIDs      FU office 2 days

## 2013-07-15 NOTE — MAU Provider Note (Signed)
Chief Complaint: Ectopic Pregnancy, Nausea and Abdominal Pain   First Provider Initiated Contact with Patient 07/15/13 0246     SUBJECTIVE HPI: Anita Wiggins is a 28 y.o. G2P1001 at [redacted]w[redacted]d by LMP with diagnosed ectopic pregnancy who presents to maternity admissions reporting increased RLQ pain and rectal pressure which woke her up from sleep. She was seen in office on 4/19 and had quant hcg drawn.  On 4/21 she was sent to MAU by Dr Helane Rima because quant hcg inappropriately rising and 36mm right adnexal mass identified on ultrasound.  She was given a dose of MTX on 4/21.  On 4/24, she followed up in the office for Day 4 labs and quant had inappropriately risen, so second dose of MTX given.  Pt reports light bleeding x5 days with no change today.  She does also report constipation, with no BM in 1 week despite use of Colace and suppositories.  She denies vaginal itching/burning, urinary symptoms, h/a, dizziness, n/v, or fever/chills.     Past Medical History  Diagnosis Date  . No pertinent past medical history   . Shingles    Past Surgical History  Procedure Laterality Date  . No past surgeries    . Umbilical hernia repair     History   Social History  . Marital Status: Married    Spouse Name: N/A    Number of Children: N/A  . Years of Education: N/A   Occupational History  . Not on file.   Social History Main Topics  . Smoking status: Never Smoker   . Smokeless tobacco: Never Used  . Alcohol Use: No  . Drug Use: No  . Sexual Activity: Yes   Other Topics Concern  . Not on file   Social History Narrative  . No narrative on file   No current facility-administered medications on file prior to encounter.   Current Outpatient Prescriptions on File Prior to Encounter  Medication Sig Dispense Refill  . calcium carbonate (TUMS - DOSED IN MG ELEMENTAL CALCIUM) 500 MG chewable tablet Chew 1 tablet by mouth daily as needed. For acid reflux.      . Prenatal Vit-Fe Fumarate-FA (PRENATAL  MULTIVITAMIN) TABS Take 1 tablet by mouth daily.       Allergies  Allergen Reactions  . No Known Drug Allergy     ROS: Pertinent items in HPI  OBJECTIVE Blood pressure 102/74, pulse 75, temperature 98.5 F (36.9 C), resp. rate 18, height 5\' 9"  (1.753 m), weight 60.601 kg (133 lb 9.6 oz), last menstrual period 05/19/2013, unknown if currently breastfeeding. GENERAL: Well-developed, well-nourished female in no acute distress.  HEENT: Normocephalic HEART: normal rate RESP: normal effort ABDOMEN: Soft, tender in RLQ, no rebound tenderness, no guarding EXTREMITIES: Nontender, no edema NEURO: Alert and oriented SPECULUM EXAM: Deferred  LAB RESULTS Quant hcg pending  IMAGING US Ob Comp Less 14 Wks  07/15/2013   CLINICAL DATA:  Right lower quadrant abdominal pain. Known ectopic pregnancy, status post methotrexate x 2.  EXAM: OBSTETRIC <14 WK [place] AND TRANSVAGINAL OB US  TECHNIQUE: Both transabdominal and transvaginal ultrasound examinations were performed for complete evaluation of the gestation as well as the maternal uterus, adnexal regions, and pelvic cul-de-sac. Transvaginal technique was performed to assess early pregnancy.  COMPARISON:  None.  FINDINGS: Intrauterine gestational sac:  None seen.  Yolk sac:  N/A  Embryo:  N/A  Maternal uterus/adnexae: At the right adnexa, note is made of a 3.5 x 2.1 x 2.2 cm mass, adjacent to the  right ovary. Associated color Doppler blood flow is seen. This is compatible with the patient's known ectopic pregnancy. A small amount of complex free fluid is noted about the right adnexa, with associated clot. Findings are compatible with mild hemorrhage status post rupture.  The endometrial echo complex measures 0.7 cm, within normal limits. The uterus is unremarkable in appearance.  The left ovary is unremarkable in appearance, measuring 3.0 x 1.4 x 1.7 cm.  IMPRESSION: 3.5 x 2.1 x 2.2 cm mass adjacent to the right ovary, compatible with the patient's known ectopic  pregnancy. Small amount of complex free fluid about the right adnexa, with associated clot. Findings compatible with mild hemorrhage status post rupture.  Critical Value/emergent results were called by telephone at the time of interpretation on 07/15/2013 at 3:54 AM to Dr. Lattie Haw LEFTWICH-KIRBY, who verbally acknowledged these results.   Electronically Signed   By: Garald Balding M.D.   On: 07/15/2013 03:54   US Ob Transvaginal  07/15/2013   CLINICAL DATA:  Right lower quadrant abdominal pain. Known ectopic pregnancy, status post methotrexate x 2.  EXAM: OBSTETRIC <14 WK [place] AND TRANSVAGINAL OB US  TECHNIQUE: Both transabdominal and transvaginal ultrasound examinations were performed for complete evaluation of the gestation as well as the maternal uterus, adnexal regions, and pelvic cul-de-sac. Transvaginal technique was performed to assess early pregnancy.  COMPARISON:  None.  FINDINGS: Intrauterine gestational sac:  None seen.  Yolk sac:  N/A  Embryo:  N/A  Maternal uterus/adnexae: At the right adnexa, note is made of a 3.5 x 2.1 x 2.2 cm mass, adjacent to the right ovary. Associated color Doppler blood flow is seen. This is compatible with the patient's known ectopic pregnancy. A small amount of complex free fluid is noted about the right adnexa, with associated clot. Findings are compatible with mild hemorrhage status post rupture.  The endometrial echo complex measures 0.7 cm, within normal limits. The uterus is unremarkable in appearance.  The left ovary is unremarkable in appearance, measuring 3.0 x 1.4 x 1.7 cm.  IMPRESSION: 3.5 x 2.1 x 2.2 cm mass adjacent to the right ovary, compatible with the patient's known ectopic pregnancy. Small amount of complex free fluid about the right adnexa, with associated clot. Findings compatible with mild hemorrhage status post rupture.  Critical Value/emergent results were called by telephone at the time of interpretation on 07/15/2013 at 3:54 AM to Dr. Lattie Haw LEFTWICH-KIRBY,  who verbally acknowledged these results.   Electronically Signed   By: Garald Balding M.D.   On: 07/15/2013 03:54    ASSESSMENT 1. Ectopic pregnancy     PLAN Consult Dr Wynona Meals at 168ml/hour [place] in MAU to evaluate pt     Medication List    ASK your doctor about these medications       calcium carbonate 500 MG chewable tablet  Commonly known as:  TUMS - dosed in mg elemental calcium  Chew 1 tablet by mouth daily as needed. For acid reflux.     prenatal multivitamin Tabs tablet  Take 1 tablet by mouth daily.         Fatima Blank Certified Nurse-Midwife 07/15/2013  4:03 AM

## 2013-07-15 NOTE — MAU Note (Signed)
Had 2nd MTX Friday for ectopic. No BM in a week. Used suppository with no results. Having rectal pressure and increased pain RLQ. I've had vag bleeding since Sunday

## 2013-07-15 NOTE — Discharge Summary (Signed)
Physician Discharge Summary  Patient ID: BRYTTNEY NETZER MRN: [id number] DOB/AGE: 08-11-1985 28 y.o.  Admit date: 07/15/2013 Discharge date: 07/15/2013  Admission Diagnoses:  Discharge Diagnoses:  Active Problems:   Ectopic pregnancy   Discharged Condition: good  Hospital Course: Admitted with RLQ pain after MTX x 2 for right ectopic pregnancy. Observation and IV fluids with NPO. Initial Hgb stable. FU value midday dropped about 1.5 gms. However, third value was stable. Exam stable with non surgical abdomen and stable vital signs.  Consults: None  Significant Diagnostic Studies: labs:  Results for orders placed during the hospital encounter of 07/15/13 (from the past 24 hour(s))  HCG, QUANTITATIVE, PREGNANCY     Status: Abnormal   Collection Time    07/15/13  2:48 AM      Result Value Ref Range   hCG, Beta Chain, Quant, S 8120 (*) <5 mIU/mL  CBC     Status: Abnormal   Collection Time    07/15/13  4:28 AM      Result Value Ref Range   WBC 12.9 (*) 4.0 - 10.5 K/uL   RBC 4.39  3.87 - 5.11 MIL/uL   Hemoglobin 13.3  12.0 - 15.0 g/dL   HCT 38.2  36.0 - 46.0 %   MCV 87.0  78.0 - 100.0 fL   MCH 30.3  26.0 - 34.0 pg   MCHC 34.8  30.0 - 36.0 g/dL   RDW 12.9  11.5 - 15.5 %   Platelets 299  150 - 400 K/uL  CBC     Status: Abnormal   Collection Time    07/15/13 11:51 AM      Result Value Ref Range   WBC 8.6  4.0 - 10.5 K/uL   RBC 3.95  3.87 - 5.11 MIL/uL   Hemoglobin 11.8 (*) 12.0 - 15.0 g/dL   HCT 34.2 (*) 36.0 - 46.0 %   MCV 86.6  78.0 - 100.0 fL   MCH 29.9  26.0 - 34.0 pg   MCHC 34.5  30.0 - 36.0 g/dL   RDW 12.9  11.5 - 15.5 %   Platelets 256  150 - 400 K/uL  CBC     Status: Abnormal   Collection Time    07/15/13  3:51 PM      Result Value Ref Range   WBC 8.8  4.0 - 10.5 K/uL   RBC 4.10  3.87 - 5.11 MIL/uL   Hemoglobin 12.1  12.0 - 15.0 g/dL   HCT 35.6 (*) 36.0 - 46.0 %   MCV 86.8  78.0 - 100.0 fL   MCH 29.5  26.0 - 34.0 pg   MCHC 34.0  30.0 - 36.0 g/dL   RDW 13.0   11.5 - 15.5 %   Platelets 259  150 - 400 K/uL    Treatments: IV hydration  Discharge Exam: Blood pressure 101/69, pulse 78, temperature 98.3 F (36.8 C), temperature source Oral, resp. rate 18, height 5\' 9"  (1.753 m), weight 60.601 kg (133 lb 9.6 oz), last menstrual period 05/19/2013, SpO2 98.00%, unknown if currently breastfeeding. General appearance: alert, cooperative and no distress GI: soft, non-tender; bowel sounds normal; no masses,  no organomegaly  Disposition: 01-Home or Self Care     Medication List    STOP taking these medications       prenatal multivitamin Tabs tablet     Progesterone 50 MG Supp           Follow-up Information   Call in 2 days to follow  up.      Signed: Shon Millet II 07/15/2013, 4:55 PM

## 2013-07-15 NOTE — Progress Notes (Signed)
Discharge instructions provided to patient at bedside.  Activity, medications, follow up appointments, when to call the doctor and community resources discussed.  No questions at this time.  Patient left unit in stable condition with all personal belongings accompanied by staff.  Leighton Roach, RN--------------------

## 2013-07-15 NOTE — Progress Notes (Signed)
Dr Gaetano Net given update to pt's status. Will put in order of CBC and see pt. Pt and spouse aware

## 2013-07-15 NOTE — H&P (Signed)
Anita Wiggins is an 28 y.o. female with known right ectopic pregnancy. S/P MTX x 2 with last dose yesterday. Awoke about 4 am with rectal pressure. No BM in 1 week and thought it was constipation and gas pain. Tried suppository with no relief. Pain gradually became worse and presented to MAU. Some nausea earlier, better now. No vomiting, no chills, no dizziness, no syncope.  Pertinent Gynecological History: Menses: pregnant Bleeding: N/A Contraception: N/A DES exposure: denies Blood transfusions: none Sexually transmitted diseases: no past history Previous GYN Procedures: none  Last mammogram: N/A Date: N/A Last pap: normal Date: 2015 OB History: G2, P1   Menstrual History: Menarche age: unknown  Patient's last menstrual period was 05/19/2013.    Past Medical History  Diagnosis Date  . No pertinent past medical history   . Shingles     Past Surgical History  Procedure Laterality Date  . No past surgeries    . Umbilical hernia repair      Family History  Problem Relation Age of Onset  . Heart disease Maternal Grandfather     Social History:  reports that she has never smoked. She has never used smokeless tobacco. She reports that she does not drink alcohol or use illicit drugs.  Allergies:  Allergies  Allergen Reactions  . No Known Drug Allergy     Prescriptions prior to admission  Medication Sig Dispense Refill  . calcium carbonate (TUMS - DOSED IN MG ELEMENTAL CALCIUM) 500 MG chewable tablet Chew 1 tablet by mouth daily as needed. For acid reflux.      . Prenatal Vit-Fe Fumarate-FA (PRENATAL MULTIVITAMIN) TABS Take 1 tablet by mouth daily.        ROS  Blood pressure 100/69, pulse 78, temperature 98.5 F (36.9 C), resp. rate 18, height 5\' 9"  (1.753 m), weight 60.601 kg (133 lb 9.6 oz), last menstrual period 05/19/2013, SpO2 100.00%, unknown if currently breastfeeding. Physical Exam  Cardiovascular: Normal rate and regular rhythm.   Respiratory: Effort normal  and breath sounds normal.  GI: Soft.  Mild RLQ tenderness to deep palpation without mass or rebound  Abd flat, soft, normal BS x 4  Results for orders placed during the hospital encounter of 07/15/13 (from the past 24 hour(s))  HCG, QUANTITATIVE, PREGNANCY     Status: Abnormal   Collection Time    07/15/13  2:48 AM      Result Value Ref Range   hCG, Beta Chain, Quant, S 8120 (*) <5 mIU/mL  CBC     Status: Abnormal   Collection Time    07/15/13  4:28 AM      Result Value Ref Range   WBC 12.9 (*) 4.0 - 10.5 K/uL   RBC 4.39  3.87 - 5.11 MIL/uL   Hemoglobin 13.3  12.0 - 15.0 g/dL   HCT 38.2  36.0 - 46.0 %   MCV 87.0  78.0 - 100.0 fL   MCH 30.3  26.0 - 34.0 pg   MCHC 34.8  30.0 - 36.0 g/dL   RDW 12.9  11.5 - 15.5 %   Platelets 299  150 - 400 K/uL    US Ob Comp Less 14 Wks  07/15/2013   CLINICAL DATA:  Right lower quadrant abdominal pain. Known ectopic pregnancy, status post methotrexate x 2.  EXAM: OBSTETRIC <14 WK [place] AND TRANSVAGINAL OB US  TECHNIQUE: Both transabdominal and transvaginal ultrasound examinations were performed for complete evaluation of the gestation as well as the maternal uterus, adnexal regions, and  pelvic cul-de-sac. Transvaginal technique was performed to assess early pregnancy.  COMPARISON:  None.  FINDINGS: Intrauterine gestational sac:  None seen.  Yolk sac:  N/A  Embryo:  N/A  Maternal uterus/adnexae: At the right adnexa, note is made of a 3.5 x 2.1 x 2.2 cm mass, adjacent to the right ovary. Associated color Doppler blood flow is seen. This is compatible with the patient's known ectopic pregnancy. A small amount of complex free fluid is noted about the right adnexa, with associated clot. Findings are compatible with mild hemorrhage status post rupture.  The endometrial echo complex measures 0.7 cm, within normal limits. The uterus is unremarkable in appearance.  The left ovary is unremarkable in appearance, measuring 3.0 x 1.4 x 1.7 cm.  IMPRESSION: 3.5 x 2.1 x 2.2 cm  mass adjacent to the right ovary, compatible with the patient's known ectopic pregnancy. Small amount of complex free fluid about the right adnexa, with associated clot. Findings compatible with mild hemorrhage status post rupture.  Critical Value/emergent results were called by telephone at the time of interpretation on 07/15/2013 at 3:54 AM to Dr. Lattie Haw LEFTWICH-KIRBY, who verbally acknowledged these results.   Electronically Signed   By: Garald Balding M.D.   On: 07/15/2013 03:54   US Ob Transvaginal  07/15/2013   CLINICAL DATA:  Right lower quadrant abdominal pain. Known ectopic pregnancy, status post methotrexate x 2.  EXAM: OBSTETRIC <14 WK [place] AND TRANSVAGINAL OB US  TECHNIQUE: Both transabdominal and transvaginal ultrasound examinations were performed for complete evaluation of the gestation as well as the maternal uterus, adnexal regions, and pelvic cul-de-sac. Transvaginal technique was performed to assess early pregnancy.  COMPARISON:  None.  FINDINGS: Intrauterine gestational sac:  None seen.  Yolk sac:  N/A  Embryo:  N/A  Maternal uterus/adnexae: At the right adnexa, note is made of a 3.5 x 2.1 x 2.2 cm mass, adjacent to the right ovary. Associated color Doppler blood flow is seen. This is compatible with the patient's known ectopic pregnancy. A small amount of complex free fluid is noted about the right adnexa, with associated clot. Findings are compatible with mild hemorrhage status post rupture.  The endometrial echo complex measures 0.7 cm, within normal limits. The uterus is unremarkable in appearance.  The left ovary is unremarkable in appearance, measuring 3.0 x 1.4 x 1.7 cm.  IMPRESSION: 3.5 x 2.1 x 2.2 cm mass adjacent to the right ovary, compatible with the patient's known ectopic pregnancy. Small amount of complex free fluid about the right adnexa, with associated clot. Findings compatible with mild hemorrhage status post rupture.  Critical Value/emergent results were called by telephone at  the time of interpretation on 07/15/2013 at 3:54 AM to Dr. Lattie Haw LEFTWICH-KIRBY, who verbally acknowledged these results.   Electronically Signed   By: Garald Balding M.D.   On: 07/15/2013 03:54    Assessment/Plan: 28 yo G2P1 with right ectopic pregnancy S/P MTX x 2, last dose yesterday. QHCG falling. Had gradual onset of pelvic/rectal pain. Now feeling a little better. Hgb stable, not a surgical abdomen on exam, limited bleeding around right ovary C/W probable expulsion of ectopic D/W patient and husband probable expulsion, reviewed options They live about 30 minutes away, she is very stable but anxious Will admit to observation room   Shon Millet II 07/15/2013, 7:47 AM

## 2013-07-15 NOTE — MAU Note (Signed)
Dr. Gaetano Net discussed options with pt, no need for surgery at present, will get bed for pt on 3rd floor.

## 2013-07-17 NOTE — Progress Notes (Signed)
Post discharge ur review completed. 

## 2013-07-20 ENCOUNTER — Inpatient Hospital Stay (HOSPITAL_COMMUNITY): Payer: 59

## 2013-07-20 ENCOUNTER — Encounter (HOSPITAL_COMMUNITY): Payer: Self-pay | Admitting: *Deleted

## 2013-07-20 ENCOUNTER — Inpatient Hospital Stay (HOSPITAL_COMMUNITY)
Admission: AD | Admit: 2013-07-20 | Discharge: 2013-07-20 | Disposition: A | Discharge status: Home / Self Care | Payer: 59 | Source: Ambulatory Visit | Attending: Obstetrics and Gynecology | Admitting: Obstetrics and Gynecology

## 2013-07-20 DIAGNOSIS — O009 Unspecified ectopic pregnancy without intrauterine pregnancy: Secondary | ICD-10-CM

## 2013-07-20 DIAGNOSIS — R110 Nausea: Secondary | ICD-10-CM | POA: Insufficient documentation

## 2013-07-20 DIAGNOSIS — R55 Syncope and collapse: Secondary | ICD-10-CM | POA: Insufficient documentation

## 2013-07-20 DIAGNOSIS — O00109 Unspecified tubal pregnancy without intrauterine pregnancy: Secondary | ICD-10-CM | POA: Insufficient documentation

## 2013-07-20 LAB — CBC
HCT: 35.3 % — ABNORMAL LOW (ref 36.0–46.0)
Hemoglobin: 12.2 g/dL (ref 12.0–15.0)
MCH: 29.9 pg (ref 26.0–34.0)
MCHC: 34.6 g/dL (ref 30.0–36.0)
MCV: 86.5 fL (ref 78.0–100.0)
Platelets: 245 10*3/uL (ref 150–400)
RBC: 4.08 MIL/uL (ref 3.87–5.11)
RDW: 12.7 % (ref 11.5–15.5)
WBC: 8 10*3/uL (ref 4.0–10.5)

## 2013-07-20 LAB — HCG, QUANTITATIVE, PREGNANCY: hCG, Beta Chain, Quant, S: 4398 m[IU]/mL — ABNORMAL HIGH (ref <5)

## 2013-07-20 NOTE — MAU Provider Note (Signed)
Chief Complaint: Nausea and Near Syncope   First Provider Initiated Contact with Patient 07/20/13 0901     SUBJECTIVE HPI: Anita Wiggins is a 28 y.o. G2P1001 at [id number] LMP with known right tubal ectopic pregnancy who presents to maternity admissions reporting presyncopal episode associated with nausea and feeling dizzy this after getting up today. She has received methotrexate twice with last dose 07/14/2013. She was observed in house on 07/15/2013 with serial hemoglobins which remained stable. Quantitative beta hCGs: 4/21: 6242 4/24: about 10,000 (per pt, done in office) 4/25: 8120 4/27: about 8000 (per pt, done in office)   Past Medical History  Diagnosis Date  . No pertinent past medical history   . Shingles    OB History  Gravida Para Term Preterm AB SAB TAB Ectopic Multiple Living  2 1 1       1     # Outcome Date GA Lbr Len/2nd Weight Sex Delivery Anes PTL Lv  2 CUR           1 TRM 05/07/11 [redacted]w[redacted]d / 03:23 3.544 kg (7 lb 13 oz) M VAC Local  Y     Past Surgical History  Procedure Laterality Date  . No past surgeries    . Umbilical hernia repair     History   Social History  . Marital Status: Married    Spouse Name: N/A    Number of Children: N/A  . Years of Education: N/A   Occupational History  . Not on file.   Social History Main Topics  . Smoking status: Never Smoker   . Smokeless tobacco: Never Used  . Alcohol Use: No  . Drug Use: No  . Sexual Activity: Yes   Other Topics Concern  . Not on file   Social History Narrative  . No narrative on file   No current facility-administered medications on file prior to encounter.   No current outpatient prescriptions on file prior to encounter.   Allergies  Allergen Reactions  . No Known Drug Allergy     ROS: Pertinent items in HPI  OBJECTIVE Blood pressure 100/66, pulse 91, temperature 97.6 F (36.4 C), temperature source Oral, resp. rate 18, last menstrual period 05/19/2013, unknown if currently  breastfeeding.  Filed Vitals:   07/20/13 0845 07/20/13 0849 07/20/13 0851 07/20/13 0852  BP: 107/62 108/68 108/65 100/66  Pulse: 59 67 88 91  Temp: 97.6 F (36.4 C)     TempSrc: Oral     Resp: 18      GENERAL: Well-developed, well-nourished female in apparent discomfort  HEENT: Normocephalic HEART: normal rate RESP: normal effort ABDOMEN: Soft, mild-mod tenderness rt suprapubic, no guarding, minimal rebound EXTREMITIES: Nontender, no edema NEURO: grossly non-focal  LAB RESULTS Results for orders placed during the hospital encounter of 07/20/13 (from the past 24 hour(s))  HCG, QUANTITATIVE, PREGNANCY     Status: Abnormal   Collection Time    07/20/13  9:09 AM      Result Value Ref Range   hCG, Beta Chain, Quant, S 4398 (*) <5 mIU/mL  CBC     Status: Abnormal   Collection Time    07/20/13  9:09 AM      Result Value Ref Range   WBC 8.0  4.0 - 10.5 K/uL   RBC 4.08  3.87 - 5.11 MIL/uL   Hemoglobin 12.2  12.0 - 15.0 g/dL   HCT 35.3 (*) 36.0 - 46.0 %   MCV 86.5  78.0 - 100.0 fL   MCH  29.9  26.0 - 34.0 pg   MCHC 34.6  30.0 - 36.0 g/dL   RDW 12.7  11.5 - 15.5 %   Platelets 245  150 - 400 K/uL    IMAGING CLINICAL DATA: Pain, known right ectopic pregnancy, declining beta  HCG (now 9381, previously 0175), status post methotrexate.  EXAM:  TRANSVAGINAL OB ULTRASOUND  TECHNIQUE:  Transvaginal ultrasound was performed for complete evaluation of the  gestation as well as the maternal uterus, adnexal regions, and  pelvic cul-de-sac.  COMPARISON: 07/15/2013  FINDINGS:  Intrauterine gestational sac: Not visualized  Maternal uterus/adnexae: Endometrial complex measures 5 mm.  Right ovary is within normal limits. 3.1 x 2.0 x 2.3 cm ring-like  right adnexal mass, compatible with ectopic pregnancy, previously  3.5 x 2.1 x 2.2 cm.  Left ovary is within normal limits.  Small volume pelvic ascites, simple.  IMPRESSION:  3.1 cm right adnexal mass, compatible with known ectopic  pregnancy,  possibly mildly decreased.  Small volume pelvic ascites, simple.  Electronically Signed  By: Julian Hy M.D.  On: 07/20/2013 10:35          US Ob Comp Less 14 Wks  07/15/2013   CLINICAL DATA:  Right lower quadrant abdominal pain. Known ectopic pregnancy, status post methotrexate x 2.  EXAM: OBSTETRIC <14 WK [place] AND TRANSVAGINAL OB US  TECHNIQUE: Both transabdominal and transvaginal ultrasound examinations were performed for complete evaluation of the gestation as well as the maternal uterus, adnexal regions, and pelvic cul-de-sac. Transvaginal technique was performed to assess early pregnancy.  COMPARISON:  None.  FINDINGS: Intrauterine gestational sac:  None seen.  Yolk sac:  N/A  Embryo:  N/A  Maternal uterus/adnexae: At the right adnexa, note is made of a 3.5 x 2.1 x 2.2 cm mass, adjacent to the right ovary. Associated color Doppler blood flow is seen. This is compatible with the patient's known ectopic pregnancy. A small amount of complex free fluid is noted about the right adnexa, with associated clot. Findings are compatible with mild hemorrhage status post rupture.  The endometrial echo complex measures 0.7 cm, within normal limits. The uterus is unremarkable in appearance.  The left ovary is unremarkable in appearance, measuring 3.0 x 1.4 x 1.7 cm.  IMPRESSION: 3.5 x 2.1 x 2.2 cm mass adjacent to the right ovary, compatible with the patient's known ectopic pregnancy. Small amount of complex free fluid about the right adnexa, with associated clot. Findings compatible with mild hemorrhage status post rupture.  Critical Value/emergent results were called by telephone at the time of interpretation on 07/15/2013 at 3:54 AM to Dr. Lattie Haw LEFTWICH-KIRBY, who verbally acknowledged these results.   Electronically Signed   By: Garald Balding M.D.   On: 07/15/2013 03:54   US Ob Transvaginal  07/15/2013   CLINICAL DATA:  Right lower quadrant abdominal pain. Known ectopic pregnancy, status  post methotrexate x 2.  EXAM: OBSTETRIC <14 WK [place] AND TRANSVAGINAL OB US  TECHNIQUE: Both transabdominal and transvaginal ultrasound examinations were performed for complete evaluation of the gestation as well as the maternal uterus, adnexal regions, and pelvic cul-de-sac. Transvaginal technique was performed to assess early pregnancy.  COMPARISON:  None.  FINDINGS: Intrauterine gestational sac:  None seen.  Yolk sac:  N/A  Embryo:  N/A  Maternal uterus/adnexae: At the right adnexa, note is made of a 3.5 x 2.1 x 2.2 cm mass, adjacent to the right ovary. Associated color Doppler blood flow is seen. This is compatible with the patient's known ectopic pregnancy.  A small amount of complex free fluid is noted about the right adnexa, with associated clot. Findings are compatible with mild hemorrhage status post rupture.  The endometrial echo complex measures 0.7 cm, within normal limits. The uterus is unremarkable in appearance.  The left ovary is unremarkable in appearance, measuring 3.0 x 1.4 x 1.7 cm.  IMPRESSION: 3.5 x 2.1 x 2.2 cm mass adjacent to the right ovary, compatible with the patient's known ectopic pregnancy. Small amount of complex free fluid about the right adnexa, with associated clot. Findings compatible with mild hemorrhage status post rupture.  Critical Value/emergent results were called by telephone at the time of interpretation on 07/15/2013 at 3:54 AM to Dr. Lattie Haw LEFTWICH-KIRBY, who verbally acknowledged these results.   Electronically Signed   By: Garald Balding M.D.   On: 07/15/2013 03:54    MAU COURSE  C/W Dr. Helane Rima Discharge to home and call her in AM. If stable, F/U in 4 days in office.   ASSESSMENT 1. Ectopic pregnancy   Rt tubal ectopic stable with falling quants  PLAN Discharge home with reassurance. Advised rest, continue out of work.  Ruptured ectopic precautions   Medication List         acetaminophen 500 MG tablet  Commonly known as:  TYLENOL  Take 1,000 mg by mouth  every 6 (six) hours as needed for headache.       Follow-up Information   Follow up with GREWAL,MICHELLE L, MD. Call in 1 day.   Specialty:  Obstetrics and Gynecology   Contact information:   [place] [address] [phone]        Lorene Dy, CNM 07/20/2013  9:10 AM

## 2013-07-20 NOTE — Discharge Instructions (Signed)
Ectopic Pregnancy An ectopic pregnancy is when the fertilized egg attaches (implants) outside the uterus. Most ectopic pregnancies occur in the fallopian tube. Rarely do ectopic pregnancies occur on the ovary, intestine, pelvis, or cervix. In an ectopic pregnancy, the fertilized egg does not have the ability to develop into a normal, healthy baby.  A ruptured ectopic pregnancy is one in which the fallopian tube gets torn or bursts and results in internal bleeding. Often there is intense abdominal pain, and sometimes, vaginal bleeding. Having an ectopic pregnancy can be life threatening. If left untreated, this dangerous condition can lead to a blood transfusion, abdominal surgery, or even death. CAUSES  Damage to the fallopian tubes is the suspected cause in most ectopic pregnancies.  RISK FACTORS Depending on your circumstances, the risk of having an ectopic pregnancy will vary. The level of risk can be divided into three categories. High Risk  You have gone through infertility treatment.  You have had a previous ectopic pregnancy.  You have had previous tubal surgery.  You have had previous surgery to have the fallopian tubes tied (tubal ligation).  You have tubal problems or diseases.  You have been exposed to DES. DES is a medicine that was used until 1971 and had effects on babies whose mothers took the medicine.  You become pregnant while using an intrauterine device (IUD) for birth control. Moderate Risk  You have a history of infertility.  You have a history of a sexually transmitted infection (STI).  You have a history of pelvic inflammatory disease (PID).  You have scarring from endometriosis.  You have multiple sexual partners.  You smoke. Low Risk  You have had previous pelvic surgery.  You use vaginal douching.  You became sexually active before 29 years of age. SIGNS AND SYMPTOMS  An ectopic pregnancy should be suspected in anyone who has missed a period and  has abdominal pain or bleeding.  You may experience normal pregnancy symptoms, such as:  Nausea.  Tiredness.  Breast tenderness.  Other symptoms may include:  Pain with intercourse.  Irregular vaginal bleeding or spotting.  Cramping or pain on one side or in the lower abdomen.  Fast heartbeat.  Passing out while having a bowel movement.  Symptoms of a ruptured ectopic pregnancy and internal bleeding may include:  Sudden, severe pain in the abdomen and pelvis.  Dizziness or fainting.  Pain in the shoulder area. DIAGNOSIS  Tests that may be performed include:  A pregnancy test.  An ultrasound test.  Testing the specific level of pregnancy hormone in the bloodstream.  Taking a sample of uterus tissue (dilation and curettage, D&C).  Surgery to perform a visual exam of the inside of the abdomen using a thin, lighted tube with a tiny camera on the end (laparoscope). TREATMENT  An injection of a medicine called methotrexate may be given. This medicine causes the pregnancy tissue to be absorbed. It is given if:  The diagnosis is made early.  The fallopian tube has not ruptured.  You are considered to be a good candidate for the medicine. Usually, pregnancy hormone blood levels are checked after methotrexate treatment. This is to be sure the medicine is effective. It may take 4 6 weeks for the pregnancy to be absorbed (though most pregnancies will be absorbed by 3 weeks). Surgical treatment may be needed. A laparoscope may be used to remove the pregnancy tissue. If severe internal bleeding occurs, a cut (incision) may be made in the lower abdomen (laparotomy), and the  ectopic pregnancy is removed. This stops the bleeding. Part of the fallopian tube, or the whole tube, may be removed as well (salpingectomy). After surgery, pregnancy hormone tests may be done to be sure there is no pregnancy tissue left. You may receive an Rho(D) immune globulin shot if you are Rh negative and  the father is Rh positive, or if you do not know the Rh type of the father. This is to prevent problems with any future pregnancy. °SEEK IMMEDIATE MEDICAL CARE IF:  °You have any symptoms of an ectopic pregnancy. This is a medical emergency. °Document Released: 04/16/2004 Document Revised: 12/28/2012 Document Reviewed: 10/06/2012 °[employer]® Patient Information ©2014 [employer]. ° °

## 2013-07-20 NOTE — MAU Note (Signed)
Pt has had 2 doses of MTX last one 4/25. Was getting ready for doctors appointment and felt dizzy and nauseated. Called office and was told to come to MAU for evaluation. Pt reprots having some cramping on RLQ.

## 2013-07-21 ENCOUNTER — Encounter (HOSPITAL_COMMUNITY)
Admission: AD | Disposition: A | Discharge status: Home / Self Care | Payer: Self-pay | Source: Ambulatory Visit | Attending: Obstetrics & Gynecology

## 2013-07-21 ENCOUNTER — Inpatient Hospital Stay (HOSPITAL_COMMUNITY): Payer: 59 | Admitting: Anesthesiology

## 2013-07-21 ENCOUNTER — Inpatient Hospital Stay (HOSPITAL_COMMUNITY): Payer: 59

## 2013-07-21 ENCOUNTER — Encounter (HOSPITAL_COMMUNITY): Payer: 59 | Admitting: Anesthesiology

## 2013-07-21 ENCOUNTER — Encounter (HOSPITAL_COMMUNITY): Payer: Self-pay | Admitting: *Deleted

## 2013-07-21 ENCOUNTER — Ambulatory Visit (HOSPITAL_COMMUNITY)
Admission: AD | Admit: 2013-07-21 | Discharge: 2013-07-21 | Disposition: A | Discharge status: Home / Self Care | Payer: 59 | Source: Ambulatory Visit | Attending: Obstetrics & Gynecology | Admitting: Obstetrics & Gynecology

## 2013-07-21 DIAGNOSIS — O00109 Unspecified tubal pregnancy without intrauterine pregnancy: Secondary | ICD-10-CM | POA: Insufficient documentation

## 2013-07-21 HISTORY — PX: DIAGNOSTIC LAPAROSCOPY WITH REMOVAL OF ECTOPIC PREGNANCY: SHX6449

## 2013-07-21 LAB — TYPE AND SCREEN
ABO/RH(D): O POS
Antibody Screen: NEGATIVE

## 2013-07-21 LAB — CBC
HCT: 34.5 % — ABNORMAL LOW (ref 36.0–46.0)
Hemoglobin: 11.9 g/dL — ABNORMAL LOW (ref 12.0–15.0)
MCH: 29.8 pg (ref 26.0–34.0)
MCHC: 34.5 g/dL (ref 30.0–36.0)
MCV: 86.5 fL (ref 78.0–100.0)
Platelets: 289 10*3/uL (ref 150–400)
RBC: 3.99 MIL/uL (ref 3.87–5.11)
RDW: 12.8 % (ref 11.5–15.5)
WBC: 19.2 10*3/uL — ABNORMAL HIGH (ref 4.0–10.5)

## 2013-07-21 LAB — ABO/RH: ABO/RH(D): O POS

## 2013-07-21 LAB — HCG, QUANTITATIVE, PREGNANCY: hCG, Beta Chain, Quant, S: 3369 m[IU]/mL — ABNORMAL HIGH (ref <5)

## 2013-07-21 SURGERY — LAPAROSCOPY, WITH ECTOPIC PREGNANCY SURGICAL TREATMENT
Anesthesia: General | Site: Abdomen | Laterality: Right

## 2013-07-21 MED ORDER — KETOROLAC TROMETHAMINE 30 MG/ML IJ SOLN
INTRAMUSCULAR | Status: DC | PRN
  Administered 2013-07-21: 30 mg via INTRAVENOUS

## 2013-07-21 MED ORDER — FENTANYL CITRATE 0.05 MG/ML IJ SOLN
25.0000 ug | INTRAMUSCULAR | Status: DC | PRN
  Administered 2013-07-21 (×2): 50 ug via INTRAVENOUS

## 2013-07-21 MED ORDER — LIDOCAINE HCL (CARDIAC) 20 MG/ML IV SOLN
INTRAVENOUS | Status: DC | PRN
  Administered 2013-07-21: 50 mg via INTRAVENOUS

## 2013-07-21 MED ORDER — MORPHINE SULFATE 4 MG/ML IJ SOLN
2.0000 mg | Freq: Once | INTRAMUSCULAR | Status: AC
  Administered 2013-07-21: 2 mg via INTRAVENOUS
  Filled 2013-07-21: qty 1

## 2013-07-21 MED ORDER — OXYCODONE-ACETAMINOPHEN 7.5-325 MG PO TABS: 1.0000 | ORAL_TABLET | ORAL | Status: DC | PRN

## 2013-07-21 MED ORDER — MORPHINE SULFATE 4 MG/ML IJ SOLN
4.0000 mg | Freq: Once | INTRAMUSCULAR | Status: AC
  Administered 2013-07-21: 4 mg via INTRAVENOUS

## 2013-07-21 MED ORDER — METOCLOPRAMIDE HCL 5 MG/ML IJ SOLN: 10.0000 mg | Freq: Once | INTRAMUSCULAR | Status: DC | PRN

## 2013-07-21 MED ORDER — FENTANYL CITRATE 0.05 MG/ML IJ SOLN
INTRAMUSCULAR | Status: AC
Start: 2013-07-21 — End: 2013-07-21
  Administered 2013-07-21: 50 ug via INTRAVENOUS
  Filled 2013-07-21: qty 2

## 2013-07-21 MED ORDER — BUPIVACAINE HCL (PF) 0.25 % IJ SOLN
INTRAMUSCULAR | Status: DC | PRN
  Administered 2013-07-21: 3 mL

## 2013-07-21 MED ORDER — LACTATED RINGERS IV BOLUS (SEPSIS): 1000.0000 mL | Freq: Once | INTRAVENOUS | Status: DC

## 2013-07-21 MED ORDER — FAMOTIDINE IN NACL 20-0.9 MG/50ML-% IV SOLN
20.0000 mg | Freq: Once | INTRAVENOUS | Status: AC
  Administered 2013-07-21: 20 mg via INTRAVENOUS
  Filled 2013-07-21: qty 50

## 2013-07-21 MED ORDER — GLYCOPYRROLATE 0.2 MG/ML IJ SOLN
INTRAMUSCULAR | Status: DC | PRN
  Administered 2013-07-21: 0.4 mg via INTRAVENOUS

## 2013-07-21 MED ORDER — FENTANYL CITRATE 0.05 MG/ML IJ SOLN
INTRAMUSCULAR | Status: DC | PRN
  Administered 2013-07-21: 50 ug via INTRAVENOUS
  Administered 2013-07-21: 100 ug via INTRAVENOUS
  Administered 2013-07-21 (×2): 50 ug via INTRAVENOUS

## 2013-07-21 MED ORDER — IBUPROFEN 600 MG PO TABS: 600.0000 mg | ORAL_TABLET | Freq: Four times a day (QID) | ORAL | Status: DC | PRN

## 2013-07-21 MED ORDER — MEPERIDINE HCL 25 MG/ML IJ SOLN: 6.2500 mg | INTRAMUSCULAR | Status: DC | PRN

## 2013-07-21 MED ORDER — SUCCINYLCHOLINE CHLORIDE 20 MG/ML IJ SOLN
INTRAMUSCULAR | Status: DC | PRN
  Administered 2013-07-21: 120 mg via INTRAVENOUS

## 2013-07-21 MED ORDER — DEXAMETHASONE SODIUM PHOSPHATE 10 MG/ML IJ SOLN
INTRAMUSCULAR | Status: DC | PRN
  Administered 2013-07-21: 5 mg via INTRAVENOUS

## 2013-07-21 MED ORDER — NEOSTIGMINE METHYLSULFATE 10 MG/10ML IV SOLN
INTRAVENOUS | Status: DC | PRN
  Administered 2013-07-21: 2 mg via INTRAVENOUS

## 2013-07-21 MED ORDER — LACTATED RINGERS IV SOLN
INTRAVENOUS | Status: DC
  Administered 2013-07-21 (×2): via INTRAVENOUS

## 2013-07-21 MED ORDER — PROPOFOL 10 MG/ML IV BOLUS
INTRAVENOUS | Status: DC | PRN
  Administered 2013-07-21: 50 mg via INTRAVENOUS
  Administered 2013-07-21: 150 mg via INTRAVENOUS

## 2013-07-21 MED ORDER — CITRIC ACID-SODIUM CITRATE 334-500 MG/5ML PO SOLN
30.0000 mL | Freq: Once | ORAL | Status: AC
  Administered 2013-07-21: 30 mL via ORAL
  Filled 2013-07-21: qty 15

## 2013-07-21 MED ORDER — ROCURONIUM BROMIDE 100 MG/10ML IV SOLN
INTRAVENOUS | Status: DC | PRN
  Administered 2013-07-21: 5 mg via INTRAVENOUS
  Administered 2013-07-21: 20 mg via INTRAVENOUS

## 2013-07-21 MED ORDER — SODIUM CHLORIDE 0.9 % IJ SOLN
INTRAMUSCULAR | Status: DC | PRN
  Administered 2013-07-21: 10 mL

## 2013-07-21 MED ORDER — MIDAZOLAM HCL 2 MG/2ML IJ SOLN
INTRAMUSCULAR | Status: DC | PRN
  Administered 2013-07-21: 2 mg via INTRAVENOUS

## 2013-07-21 MED ORDER — ONDANSETRON HCL 4 MG/2ML IJ SOLN
INTRAMUSCULAR | Status: DC | PRN
  Administered 2013-07-21: 4 mg via INTRAVENOUS

## 2013-07-21 SURGICAL SUPPLY — 30 items
BARRIER ADHS 3X4 INTERCEED (GAUZE/BANDAGES/DRESSINGS) ×3 IMPLANT
CABLE HIGH FREQUENCY MONO STRZ (ELECTRODE) ×3 IMPLANT
CATH ROBINSON RED A/P 16FR (CATHETERS) ×3 IMPLANT
CHLORAPREP W/TINT 26ML (MISCELLANEOUS) ×3 IMPLANT
CLOTH BEACON ORANGE TIMEOUT ST (SAFETY) ×3 IMPLANT
DERMABOND ADVANCED (GAUZE/BANDAGES/DRESSINGS) ×4
DERMABOND ADVANCED .7 DNX12 (GAUZE/BANDAGES/DRESSINGS) ×2 IMPLANT
ENSEAL DEVICE STD TIP 35CM (ENDOMECHANICALS) ×3 IMPLANT
FORCEPS CUTTING 45CM 5MM (CUTTING FORCEPS) ×3 IMPLANT
GLOVE BIO SURGEON STRL SZ 6 (GLOVE) ×3 IMPLANT
GLOVE BIOGEL PI IND STRL 6 (GLOVE) ×1 IMPLANT
GLOVE BIOGEL PI IND STRL 7.0 (GLOVE) ×4 IMPLANT
GLOVE BIOGEL PI INDICATOR 6 (GLOVE) ×2
GLOVE BIOGEL PI INDICATOR 7.0 (GLOVE) ×8
GLOVE NEODERM STER SZ 7 (GLOVE) ×12 IMPLANT
GLOVE SURG SS PI 7.0 STRL IVOR (GLOVE) ×12 IMPLANT
GOWN STRL REUS W/TWL LRG LVL3 (GOWN DISPOSABLE) ×9 IMPLANT
NEEDLE INSUFFLATION 120MM (ENDOMECHANICALS) ×3 IMPLANT
NS IRRIG 1000ML POUR BTL (IV SOLUTION) ×3 IMPLANT
PACK LAPAROSCOPY BASIN (CUSTOM PROCEDURE TRAY) ×3 IMPLANT
POUCH SPECIMEN RETRIEVAL 10MM (ENDOMECHANICALS) ×3 IMPLANT
PROTECTOR NERVE ULNAR (MISCELLANEOUS) ×3 IMPLANT
SET IRRIG TUBING LAPAROSCOPIC (IRRIGATION / IRRIGATOR) ×3 IMPLANT
SUT MNCRL AB 3-0 PS2 27 (SUTURE) ×3 IMPLANT
SUT VICRYL 0 UR6 27IN ABS (SUTURE) ×6 IMPLANT
TOWEL OR 17X24 6PK STRL BLUE (TOWEL DISPOSABLE) ×3 IMPLANT
TROCAR XCEL NON-BLD 11X100MML (ENDOMECHANICALS) ×3 IMPLANT
TROCAR XCEL NON-BLD 5MMX100MML (ENDOMECHANICALS) ×3 IMPLANT
WARMER LAPAROSCOPE (MISCELLANEOUS) ×3 IMPLANT
WATER STERILE IRR 1000ML POUR (IV SOLUTION) ×3 IMPLANT

## 2013-07-21 NOTE — Transfer of Care (Signed)
Immediate Anesthesia Transfer of Care Note  Patient: Anita Wiggins  Procedure(s) Performed: Procedure(s): DIAGNOSTIC LAPAROSCOPY WITH REMOVAL OF ECTOPIC PREGNANCY (Right)  Patient Location: PACU  Anesthesia Type:General  Level of Consciousness: awake, alert  and oriented  Airway & Oxygen Therapy: Patient Spontanous Breathing and Patient connected to nasal cannula oxygen  Post-op Assessment: Report given to PACU RN and Post -op Vital signs reviewed and stable  Post vital signs: Reviewed and stable  Complications: No apparent anesthesia complications

## 2013-07-21 NOTE — Anesthesia Postprocedure Evaluation (Signed)
Anesthesia Post Note  Patient: Anita Wiggins  Procedure(s) Performed: Procedure(s) (LRB): DIAGNOSTIC LAPAROSCOPY WITH REMOVAL OF ECTOPIC PREGNANCY (Right)  Anesthesia type: General  Patient location: PACU  Post pain: Pain level controlled  Post assessment: Post-op Vital signs reviewed  Last Vitals:  Filed Vitals:   07/21/13 1605  BP:   Pulse: 66  Temp:   Resp: 16    Post vital signs: Reviewed  Level of consciousness: sedated  Complications: No apparent anesthesia complications

## 2013-07-21 NOTE — Progress Notes (Signed)
07/21/13 1400  Clinical Encounter Type  Visited With Patient;Family (briefly with pt en route to OR; pt's mom and husband Anita Wiggins)  Visit Type Spiritual support;Social support (ruptured ectopic--grief and loss)  Spiritual Encounters  Spiritual Needs Emotional;Grief support  Stress Factors  Patient Stress Factors (loss of pregnancy and tube; recent miscarriage)  Family Stress Factors (loss of pregnancy and tube; recent miscarriage)   Met [place] briefly as she was entering OR, providing pastoral presence.  Visited with her mom and husband Anita Wiggins in the waiting room, providing further emotional and spiritual support.  They report good support from family and friends, and name that faith is a huge part of their coping and healing.  Provided Comfort Packet to family, introducing support resources, because Anita Wiggins was not familiar with it.  Family aware of ongoing chaplain availability.  Please page as needs arise:  [phone].  Thank you.  Anita Wiggins, Anita Wiggins

## 2013-07-21 NOTE — Anesthesia Preprocedure Evaluation (Signed)
Anesthesia Evaluation  Patient identified by MRN, date of birth, ID band Patient awake    Reviewed: Allergy & Precautions, H&P , NPO status , Patient's Chart, lab work & pertinent test results  Airway Mallampati: I TM Distance: >3 FB Neck ROM: Full    Dental no notable dental hx. (+) Teeth Intact   Pulmonary neg pulmonary ROS,  breath sounds clear to auscultation  Pulmonary exam normal       Cardiovascular negative cardio ROS  Rhythm:Regular Rate:Normal     Neuro/Psych negative neurological ROS  negative psych ROS   GI/Hepatic negative GI ROS, Neg liver ROS,   Endo/Other  negative endocrine ROS  Renal/GU negative Renal ROS  negative genitourinary   Musculoskeletal negative musculoskeletal ROS (+)   Abdominal (+)  Abdomen: tender.    Peds  Hematology negative hematology ROS (+)   Anesthesia Other Findings   Reproductive/Obstetrics (+) Pregnancy Ectopic pregnancy                           Anesthesia Physical Anesthesia Plan  ASA: I and emergent  Anesthesia Plan: General   Post-op Pain Management:    Induction: Intravenous, Rapid sequence and Cricoid pressure planned  Airway Management Planned: Oral ETT  Additional Equipment:   Intra-op Plan:   Post-operative Plan: Extubation in OR  Informed Consent: I have reviewed the patients History and Physical, chart, labs and discussed the procedure including the risks, benefits and alternatives for the proposed anesthesia with the patient or authorized representative who has indicated his/her understanding and acceptance.   Dental advisory given  Plan Discussed with: Anesthesiologist, CRNA and Surgeon  Anesthesia Plan Comments:         Anesthesia Quick Evaluation

## 2013-07-21 NOTE — H&P (Signed)
Anita Wiggins is an 28 y.o. female with know right sided ectopic pregnancy.  The patient has had labs followed in the office after 2 MTX injections.  She was observed in the hospital with likely separation pain a few days ago and discharged home when hemoglobins remained stable.  She represented yesterday am and ultrasound was unchanged.  Today, she had increase in pain and ultrasound suspicious for ruptured ectopic.    Pertinent Gynecological History: Menses: n/a Bleeding: n/a Contraception: none DES exposure: unknown Blood transfusions: none Sexually transmitted diseases: no past history Previous GYN Procedures: none  Last mammogram: n/a Date: n/a Last pap: unknown Date: n/a OB History: G3, P1   Menstrual History: Menarche age: n/a  Patient's last menstrual period was 05/19/2013.    Past Medical History  Diagnosis Date  . No pertinent past medical history   . Shingles     Past Surgical History  Procedure Laterality Date  . No past surgeries    . Umbilical hernia repair      Family History  Problem Relation Age of Onset  . Heart disease Maternal Grandfather     Social History:  reports that she has never smoked. She has never used smokeless tobacco. She reports that she does not drink alcohol or use illicit drugs.  Allergies: No Known Allergies  Prescriptions prior to admission  Medication Sig Dispense Refill  . acetaminophen (TYLENOL) 500 MG tablet Take 1,000 mg by mouth every 6 (six) hours as needed for headache.        Review of Systems  Constitutional: Positive for chills and diaphoresis.  Respiratory: Negative.   Cardiovascular: Negative.   Gastrointestinal: Positive for abdominal pain.    Temperature 98.5 F (36.9 C), temperature source Oral, resp. rate 18, last menstrual period 05/19/2013, SpO2 100.00%, unknown if currently breastfeeding. Physical Exam  Constitutional: She is oriented to person, place, and time. She appears well-developed and  well-nourished.  GI: Soft. There is tenderness. There is rebound and guarding.  Neurological: She is alert and oriented to person, place, and time.  Skin: Skin is warm.  Psychiatric: She has a normal mood and affect. Her behavior is normal.    Results for orders placed during the hospital encounter of 07/21/13 (from the past 24 hour(s))  CBC     Status: Abnormal   Collection Time    07/21/13  1:07 PM      Result Value Ref Range   WBC 19.2 (*) 4.0 - 10.5 K/uL   RBC 3.99  3.87 - 5.11 MIL/uL   Hemoglobin 11.9 (*) 12.0 - 15.0 g/dL   HCT 34.5 (*) 36.0 - 46.0 %   MCV 86.5  78.0 - 100.0 fL   MCH 29.8  26.0 - 34.0 pg   MCHC 34.5  30.0 - 36.0 g/dL   RDW 12.8  11.5 - 15.5 %   Platelets 289  150 - 400 K/uL    US Ob Transvaginal  07/21/2013   CLINICAL DATA:  Known ectopic pregnancy.  Increasing pain.  EXAM: TRANSVAGINAL OB ULTRASOUND  TECHNIQUE: Transvaginal ultrasound was performed for complete evaluation of the gestation as well as the maternal uterus, adnexal regions, and pelvic cul-de-sac.  COMPARISON:  07/20/2013  FINDINGS: Intrauterine gestational sac: None visualized  Yolk sac:  Not visualize  Embryo:  Not visualized  Maternal uterus/adnexae: The ring de cystic structure previously seen in the right adnexa is no longer visualized. There is an increasing heterogeneous, complex echogenic masslike area in this region which measures 3.2  x 3.2 x 2.6 cm. In addition, increasing complex free fluid noted in the pelvis, now a large. Findings are concerning for ruptured ectopic pregnancy. The patient was significantly tender during the study.  IMPRESSION: Increasing complex heterogeneous echogenic area in the previous region of the right ectopic pregnancy. Increasing free fluid throughout the pelvis. Findings concerning for ruptured ectopic pregnancy.  Critical Value/emergent results were called by telephone at the time of interpretation on 07/21/2013 at 1:13 PM to [facility] , who verbally acknowledged  these results.   Electronically Signed   By: Rolm Baptise M.D.   On: 07/21/2013 13:13   US Ob Transvaginal  07/20/2013   CLINICAL DATA:  Pain, known right ectopic pregnancy, declining beta HCG (now 4398, previously 8120), status post methotrexate.  EXAM: TRANSVAGINAL OB ULTRASOUND  TECHNIQUE: Transvaginal ultrasound was performed for complete evaluation of the gestation as well as the maternal uterus, adnexal regions, and pelvic cul-de-sac.  COMPARISON:  07/15/2013  FINDINGS: Intrauterine gestational sac: Not visualized  Maternal uterus/adnexae: Endometrial complex measures 5 mm.  Right ovary is within normal limits. 3.1 x 2.0 x 2.3 cm ring-like right adnexal mass, compatible with ectopic pregnancy, previously 3.5 x 2.1 x 2.2 cm.  Left ovary is within normal limits.  Small volume pelvic ascites, simple.  IMPRESSION: 3.1 cm right adnexal mass, compatible with known ectopic pregnancy, possibly mildly decreased.  Small volume pelvic ascites, simple.   Electronically Signed   By: Julian Hy M.D.   On: 07/20/2013 10:35    Assessment/Plan: 27yo with ruptured right ectopic pregnancy -L/S right salpingectomy.  Counseled for risk of bleeding, infection, scarring and loss of fallopian tube.  Understands risk of converting to open procedure.  Also, understands risk of damage to surrounding structures.  All questions were answered.   Linda Hedges 07/21/2013, 1:42 PM

## 2013-07-21 NOTE — MAU Note (Signed)
Ectopic preg, has received 2 doses MTX. Sent from office for further eval due to pain.  Pt VERY uncomfortable- holding abd, unable to stand upright. [place] ready for pt- sent directly there.

## 2013-07-21 NOTE — MAU Provider Note (Signed)
History     CSN: [id number]  Arrival date and time: 07/21/13 1213   First Provider Initiated Contact with Patient 07/21/13 1259      Chief Complaint  Patient presents with  . Abdominal Pain   Abdominal Pain    Anita Wiggins is a 28 y.o. G2P1001 who has a known ectopic pregnancy. She has received two doses on MTX with the last dose on 07/14/13. She states that today around 10:00 am she had sudden onset lower abdominal pain, and increased vaginal bleeding. She rates her pain 8/10 at this time.   Past Medical History  Diagnosis Date  . No pertinent past medical history   . Shingles     Past Surgical History  Procedure Laterality Date  . No past surgeries    . Umbilical hernia repair      Family History  Problem Relation Age of Onset  . Heart disease Maternal Grandfather     History  Substance Use Topics  . Smoking status: Never Smoker   . Smokeless tobacco: Never Used  . Alcohol Use: No    Allergies:  Allergies  Allergen Reactions  . No Known Drug Allergy     Prescriptions prior to admission  Medication Sig Dispense Refill  . acetaminophen (TYLENOL) 500 MG tablet Take 1,000 mg by mouth every 6 (six) hours as needed for headache.        Review of Systems  Gastrointestinal: Positive for abdominal pain.   Physical Exam   Last menstrual period 05/19/2013, unknown if currently breastfeeding.  Physical Exam  Nursing note and vitals reviewed. Constitutional: She is oriented to person, place, and time. She appears well-developed and well-nourished. No distress.  grimacing and uncomfortable.   Cardiovascular: Normal rate.   Respiratory: Effort normal.  GI: Soft. She exhibits no mass. There is tenderness. There is rebound.  Neurological: She is alert and oriented to person, place, and time.  Skin: Skin is warm and dry.  Psychiatric: She has a normal mood and affect.    MAU Course  Procedures  EXAM:  TRANSVAGINAL OB ULTRASOUND  TECHNIQUE:  Transvaginal  ultrasound was performed for complete evaluation of the  gestation as well as the maternal uterus, adnexal regions, and  pelvic cul-de-sac.   COMPARISON: 07/20/2013   FINDINGS:  Intrauterine gestational sac: None visualized  Yolk sac: Not visualize  Embryo: Not visualized  Maternal uterus/adnexae: The ring de cystic structure previously  seen in the right adnexa is no longer visualized. There is an  increasing heterogeneous, complex echogenic masslike area in this  region which measures 3.2 x 3.2 x 2.6 cm. In addition, increasing  complex free fluid noted in the pelvis, now a large. Findings are  concerning for ruptured ectopic pregnancy. The patient was  significantly tender during the study.   IMPRESSION:  Increasing complex heterogeneous echogenic area in the previous  region of the right ectopic pregnancy. Increasing free fluid  throughout the pelvis. Findings concerning for ruptured ectopic  pregnancy.   Critical Value/emergent results were called by telephone at the time  of interpretation on 07/21/2013 at 1:13 PM to [facility] , who  verbally acknowledged these results.  Electronically Signed  By: Rolm Baptise M.D.  On: 07/21/2013 13:13   1314: C/W Dr. Lynnette Caffey, will get CBC, Type and screen, HCG and give 2 mg morphine at this time. Her or Dr. Helane Rima will be over shortly to see the patient.   Assessment and Plan  Ectopic pregnancy To the OR  Anita Wiggins 07/21/2013, 1:02 PM

## 2013-07-21 NOTE — Anesthesia Procedure Notes (Signed)
Procedure Name: Intubation Date/Time: 07/21/2013 2:18 PM Performed by: Jonna Munro Pre-anesthesia Checklist: Suction available, Emergency Drugs available, Timeout performed, Patient identified and Patient being monitored Patient Re-evaluated:Patient Re-evaluated prior to inductionOxygen Delivery Method: Circle system utilized Preoxygenation: Pre-oxygenation with 100% oxygen Intubation Type: IV induction, Rapid sequence and Cricoid Pressure applied Laryngoscope Size: Mac and 3 Grade View: Grade I Tube type: Oral Tube size: 7.0 mm Number of attempts: 1 Airway Equipment and Method: Stylet Secured at: 21 cm Tube secured with: Tape Dental Injury: Teeth and Oropharynx as per pre-operative assessment

## 2013-07-21 NOTE — Discharge Instructions (Signed)
Call MD for T>100.4, severe abdominal pain, intractable nausea and/or vomiting.  Call office to schedule postop appointment in 1 week with quant to be drawn at that appointment.  No driving while taking narcotics.  Pelvic rest x 4 weeks.     Can Take Ibuprofen/Motrin / Advil until 9PM 07/21/13  Use heating pad on abd and upper shoulders when have discomfort  Increase oral intake next 48hours. Especially waterDISCHARGE INSTRUCTIONS: Laparoscopy  The following instructions have been prepared to help you care for yourself upon your return home today.  Wound care:  Do not get the incision wet for the first 24 hours. The incision should be kept clean and dry.  The Band-Aids or dressings may be removed the day after surgery.  Should the incision become sore, red, and swollen after the first week, check with your doctor.  Personal hygiene:  Shower the day after your procedure.  Activity and limitations:  Do NOT drive or operate any equipment today.  Do NOT lift anything more than 15 pounds for 2-3 weeks after surgery.  Do NOT rest in bed all day.  Walking is encouraged. Walk each day, starting slowly with 5-minute walks 3 or 4 times a day. Slowly increase the length of your walks.  Walk up and down stairs slowly.  Do NOT do strenuous activities, such as golfing, playing tennis, bowling, running, biking, weight lifting, gardening, mowing, or vacuuming for 2-4 weeks. Ask your doctor when it is okay to start.  Diet: Eat a light meal as desired this evening. You may resume your usual diet tomorrow.  Return to work: This is dependent on the type of work you do. For the most part you can return to a desk job within a week of surgery. If you are more active at work, please discuss this with your doctor.  What to expect after your surgery: You may have a slight burning sensation when you urinate on the first day. You may have a very small amount of blood in the urine. Expect to have a small  amount of vaginal discharge/light bleeding for 1-2 weeks. It is not unusual to have abdominal soreness and bruising for up to 2 weeks. You may be tired and need more rest for about 1 week. You may experience shoulder pain for 24-72 hours. Lying flat in bed may relieve it.  Call your doctor for any of the following:  Develop a fever of 100.4 or greater  Inability to urinate 6 hours after discharge from hospital  Severe pain not relieved by pain medications  Persistent of heavy bleeding at incision site  Redness or swelling around incision site after a week  Increasing nausea or vomiting  Patient Signature________________________________________ Nurse Signature_________________________________________

## 2013-07-21 NOTE — Op Note (Signed)
Anita Wiggins PROCEDURE DATE: 07/21/2013  PREOPERATIVE DIAGNOSIS: Ruptured right ectopic pregnancy POSTOPERATIVE DIAGNOSIS: Ruptured right fallopian tube ectopic pregnancy PROCEDURE: Laparoscopic right salpingectomy and removal of ectopic pregnancy SURGEON:  Dr. Linda Hedges  INDICATIONS: 28 y.o. G3P1 at [redacted]w[redacted]d here for with ruptured ectopic pregnancy. On exam, she had stable vital signs, and an acute abdomen. Hgb 11.9, blood type O positive. Patient was counseled regarding need for laparoscopic salpingectomy. Risks of surgery including bleeding which may require transfusion or reoperation, infection, injury to bowel or other surrounding organs, need for additional procedures including laparotomy and other postoperative/anesthesia complications were explained to patient.  Written informed consent was obtained.  FINDINGS:  600cc of hemoperitoneum.  Dilated right fallopian tube containing ectopic gestation. Small normal appearing uterus, normal left fallopian tube, right ovary and left ovary.  Lesions suspicious for endometriosis in right posterior culdesac.    ANESTHESIA: General ESTIMATED BLOOD LOSS: 600 ml of hemoperitoneum URINE OUTPUT: 50 ml SPECIMENS: right fallopian tube containing ectopic gestation COMPLICATIONS: None immediate  PROCEDURE IN DETAIL:  The patient was taken to the operating room where general anesthesia was administered and was found to be adequate.  She was placed in the dorsal lithotomy position, and was prepped and draped in a sterile manner.  A Foley catheter was inserted into her bladder and attached to constant drainage and a uterine manipulator was then advanced into the uterus .  After an adequate timeout was performed, attention was then turned to the patient's abdomen where a 10-mm skin incision was made on the umbilical fold.  The Veress needle was carefully introduced into the peritoneal cavity at a 45-degree angle into the abdominal wall.  Intraperitoneal placement was  confirmed by drop in intraabdominal pressure with insufflation of carbon dioxide gas.  Adequate pneumoperitoneum was obtained, and the 10-mm trocar and sleeve were then advanced without difficulty into the abdomen where intraabdominal placement was confirmed by the laparoscope. A survey of the patient's pelvis and abdomen revealed the findings as above.  Left lower quadrant 28mm port was placed under direct visualization.   Attention was then turned to the right fallopian tube which was grasped and ligated from the underlying mesosalpinx and uterine attachment using the Gyrus instrument.  Good hemostasis was noted.  The specimen was placed in an EndoCatch bag and removed from the abdomen intact. The Nezhat suction irrigator was then used to suction the hemoperitoneum and irrigate the pelvis. The abdomen was desufflated, and all instruments were removed.  The infraumbilical fascial incision was reapproximated with 0 Vicryl figure-of-eight stiches; and all skin incisions were closed with a 3-0 Monocryl subcuticular stitch. The patient tolerated the procedures well.  All instruments, needles, and sponge counts were correct x 2. The patient was taken to the recovery room in stable condition.    Linda Hedges 07/21/2013 3:26 PM

## 2013-07-24 ENCOUNTER — Encounter (HOSPITAL_COMMUNITY): Payer: Self-pay | Admitting: Obstetrics & Gynecology

## 2013-08-17 ENCOUNTER — Other Ambulatory Visit: Payer: Self-pay | Admitting: Obstetrics and Gynecology

## 2014-01-22 ENCOUNTER — Encounter (HOSPITAL_COMMUNITY): Payer: Self-pay | Admitting: Obstetrics & Gynecology

## 2014-04-10 ENCOUNTER — Encounter (HOSPITAL_COMMUNITY): Payer: Self-pay

## 2014-04-12 ENCOUNTER — Inpatient Hospital Stay (HOSPITAL_COMMUNITY): Admission: AD | Admit: 2014-04-12 | Payer: 59 | Source: Ambulatory Visit | Admitting: Obstetrics and Gynecology

## 2014-05-16 ENCOUNTER — Encounter (HOSPITAL_COMMUNITY): Payer: Self-pay | Admitting: *Deleted

## 2014-05-31 ENCOUNTER — Other Ambulatory Visit (HOSPITAL_COMMUNITY)
Admission: RE | Admit: 2014-05-31 | Discharge: 2014-05-31 | Disposition: A | Discharge status: Home / Self Care | Payer: 59 | Source: Ambulatory Visit | Attending: Pulmonary Disease | Admitting: Pulmonary Disease

## 2014-09-05 LAB — OB RESULTS CONSOLE GC/CHLAMYDIA
Chlamydia: NEGATIVE
Gonorrhea: NEGATIVE

## 2014-09-05 LAB — OB RESULTS CONSOLE ABO/RH: RH Type: POSITIVE

## 2014-09-05 LAB — OB RESULTS CONSOLE ANTIBODY SCREEN: Antibody Screen: NEGATIVE

## 2014-09-05 LAB — OB RESULTS CONSOLE RPR: RPR: NONREACTIVE

## 2014-09-05 LAB — OB RESULTS CONSOLE RUBELLA ANTIBODY, IGM: Rubella: IMMUNE

## 2014-09-05 LAB — OB RESULTS CONSOLE HEPATITIS B SURFACE ANTIGEN: Hepatitis B Surface Ag: NEGATIVE

## 2014-09-05 LAB — OB RESULTS CONSOLE HIV ANTIBODY (ROUTINE TESTING): HIV: NONREACTIVE

## 2014-09-11 IMAGING — US US OB TRANSVAGINAL
1 series · 14 of 28 positions shown · non-contrast
Comparison: 07/15/2013

CLINICAL DATA: Pain, known right ectopic pregnancy, declining beta
HCG (now 0193, previously 7434), status post methotrexate.

EXAM:
TRANSVAGINAL OB ULTRASOUND
TECHNIQUE: Transvaginal ultrasound was performed for complete evaluation of the
gestation as well as the maternal uterus, adnexal regions, and
pelvic cul-de-sac.

[Series 1: us ob transvaginal · 43 acquisitions, 14 frames shown]
[im 2/43]
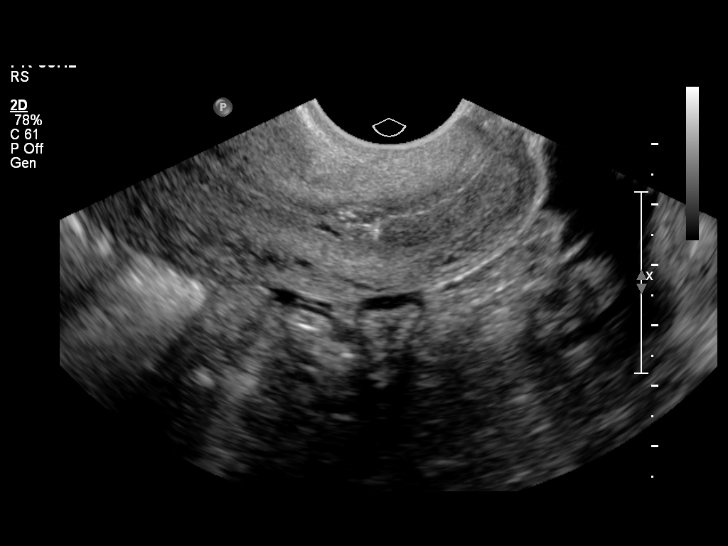
[im 5/43]
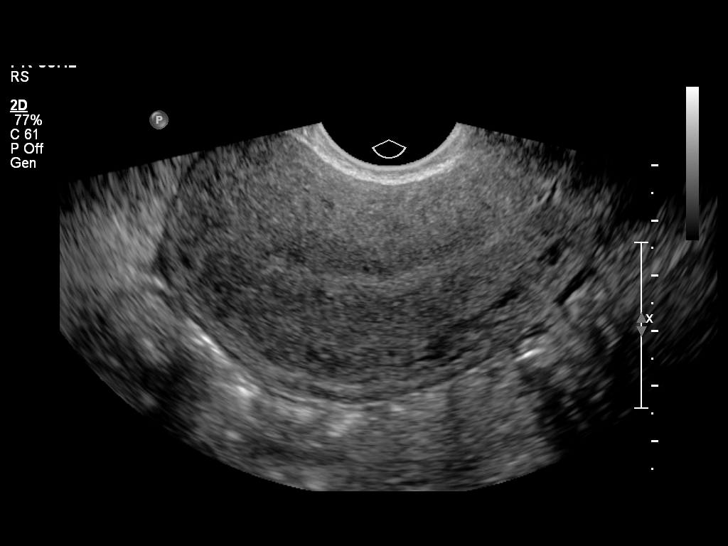
[im 8/43]
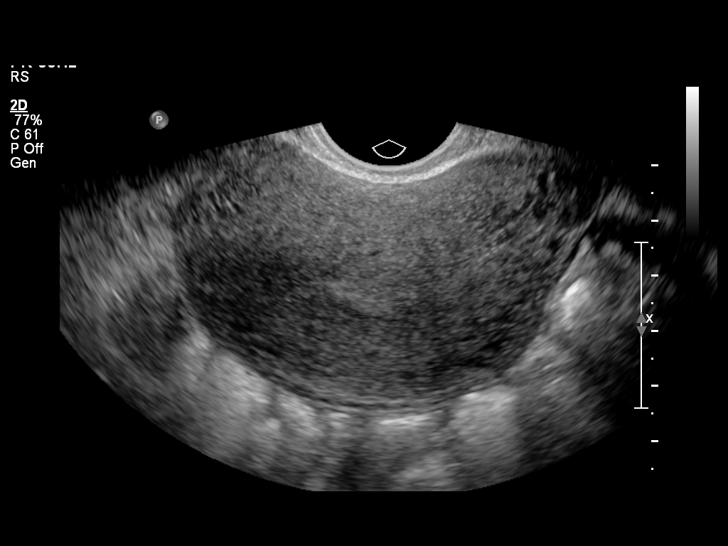
[im 11/43]
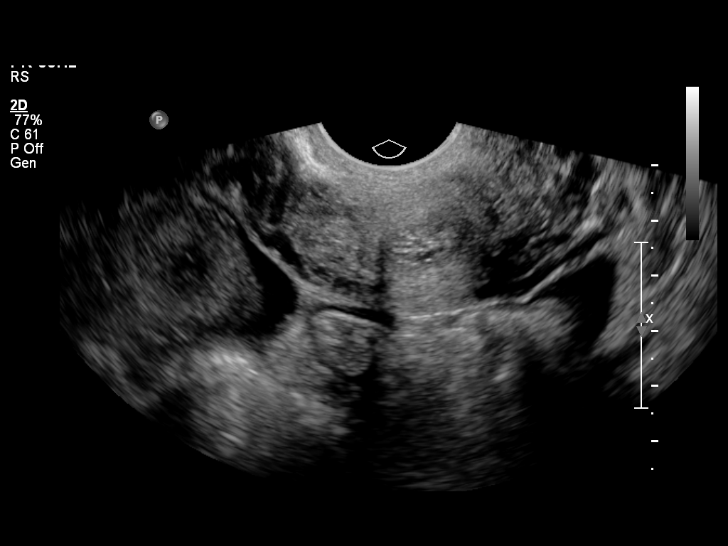
[im 15/43]
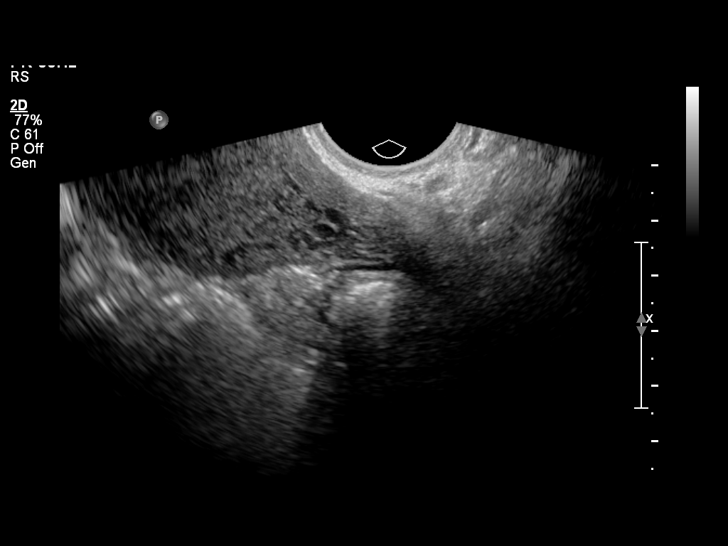
[im 18/43]
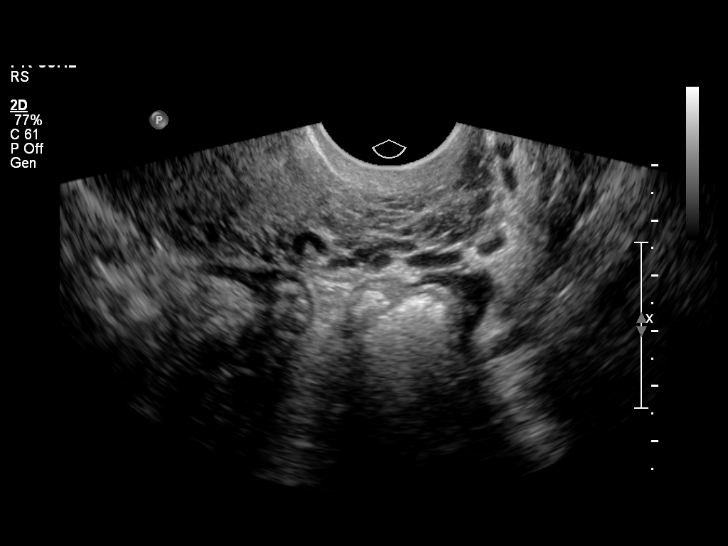
[im 21/43]
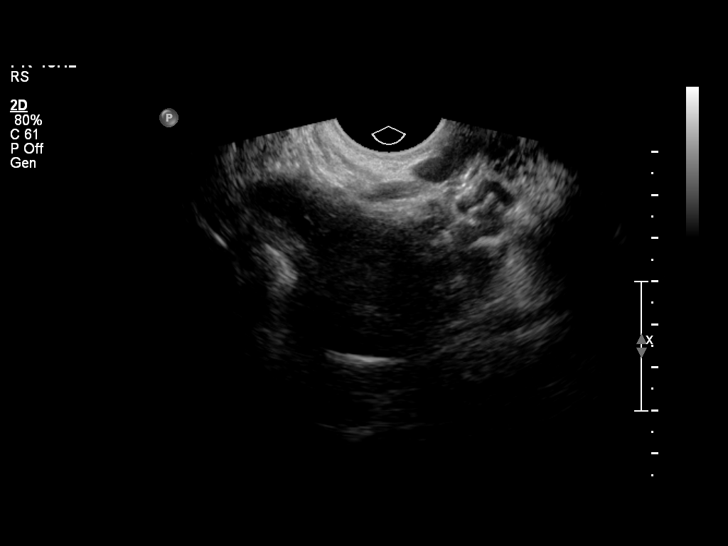
[im 24/43]
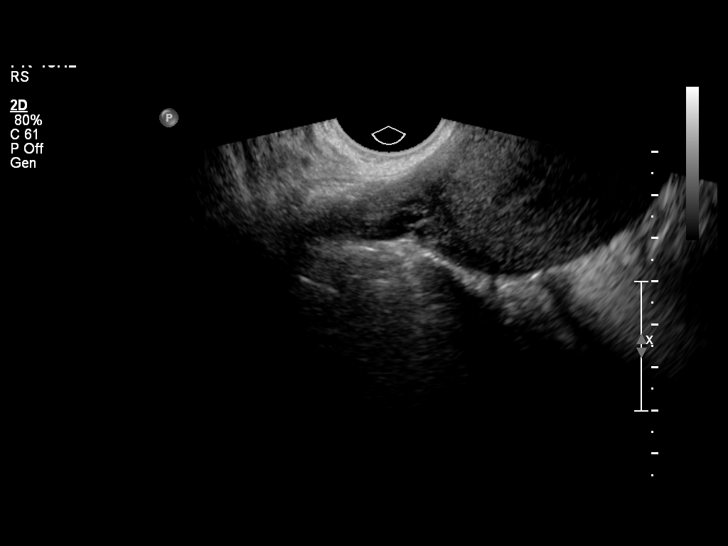
[im 27/43]
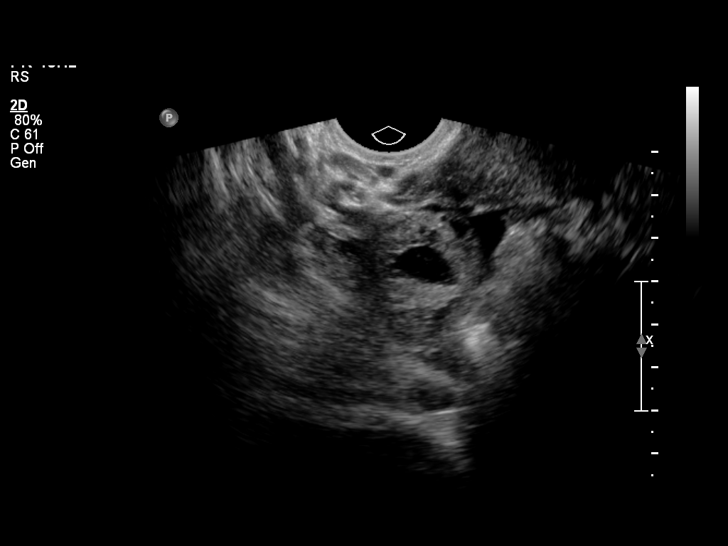
[im 30/43]
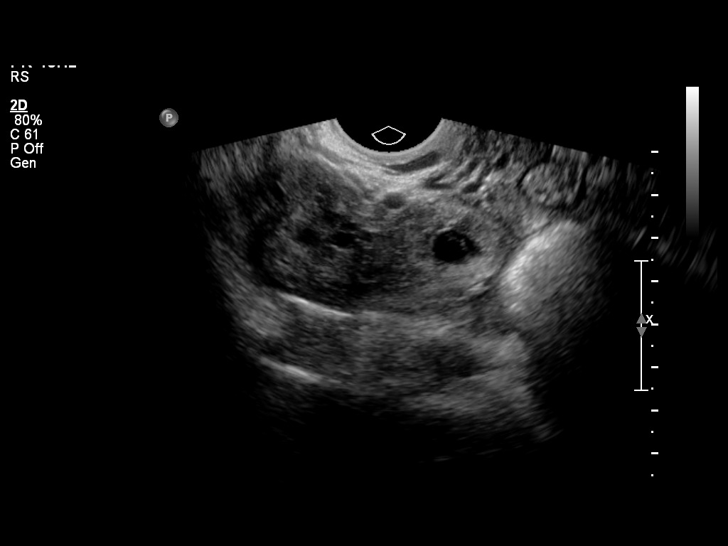
[im 33/43]
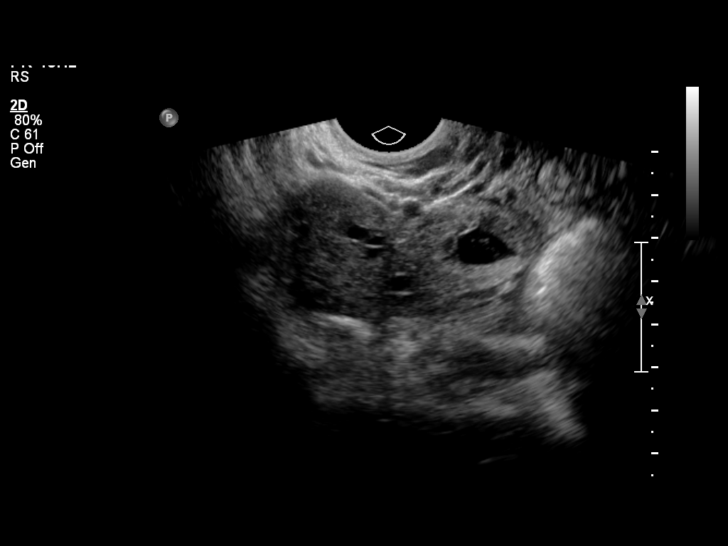
[im 36/43]
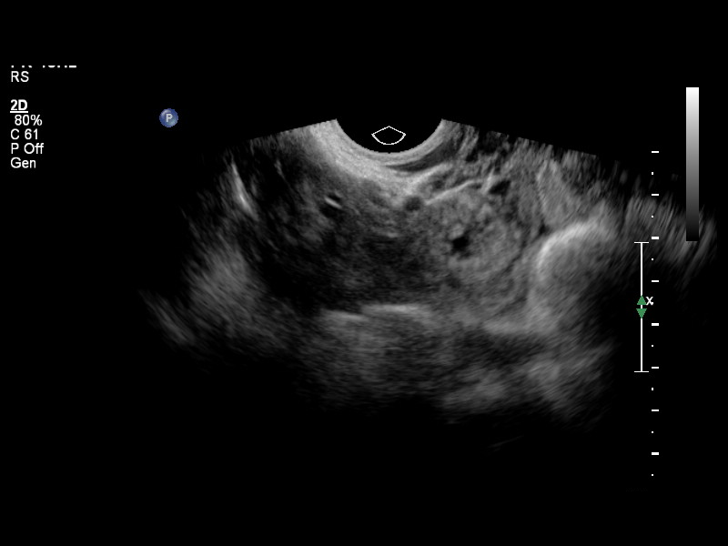
[im 39/43]
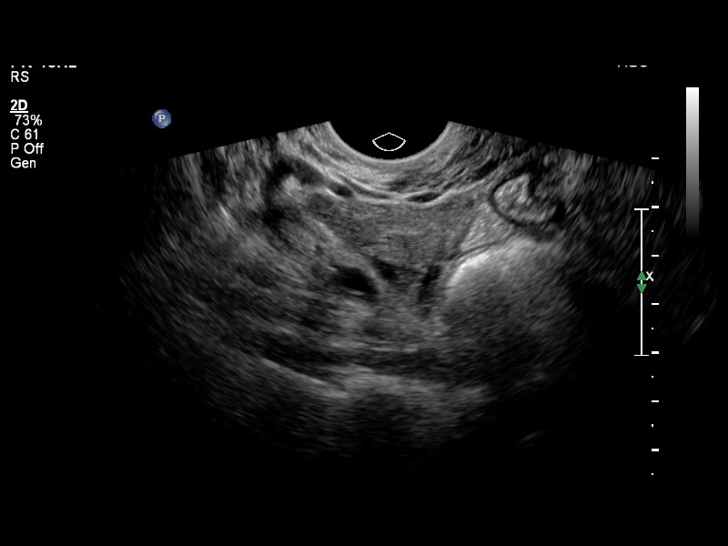
[im 43/43]
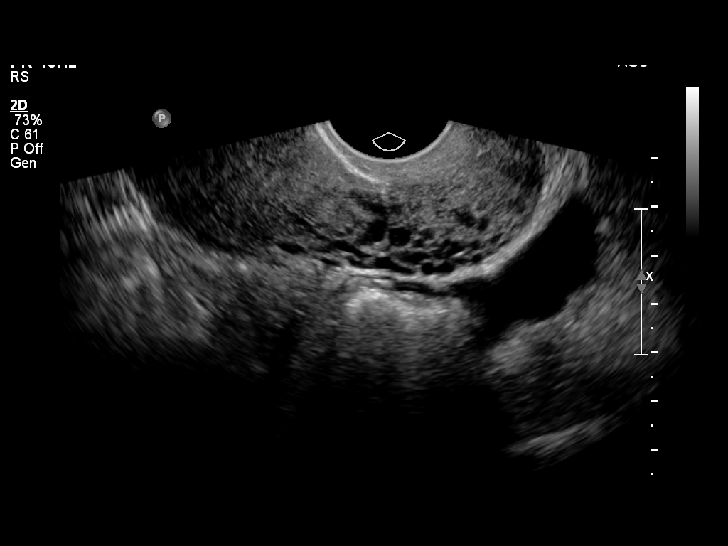

[14 of 28 positions shown; findings below may reference images not displayed]

FINDINGS: Intrauterine gestational sac: Not visualized

Maternal uterus/adnexae: Endometrial complex measures 5 mm.

Right ovary is within normal limits. 3.1 x 2.0 x 2.3 cm ring-like
right adnexal mass, compatible with ectopic pregnancy, previously
3.5 x 2.1 x 2.2 cm.

Left ovary is within normal limits.

Small volume pelvic ascites, simple.
IMPRESSION: 3.1 cm right adnexal mass, compatible with known ectopic pregnancy,
possibly mildly decreased.

Small volume pelvic ascites, simple.

## 2014-09-12 IMAGING — US US OB TRANSVAGINAL
1 series · 13 of 21 positions shown · non-contrast
Comparison: 07/20/2013

CLINICAL DATA: Known ectopic pregnancy.  Increasing pain.

EXAM:
TRANSVAGINAL OB ULTRASOUND
TECHNIQUE: Transvaginal ultrasound was performed for complete evaluation of the
gestation as well as the maternal uterus, adnexal regions, and
pelvic cul-de-sac.

[Series 1: us ob comp less 14 wks · 21 acquisitions, 13 frames shown]
[im 1/21]
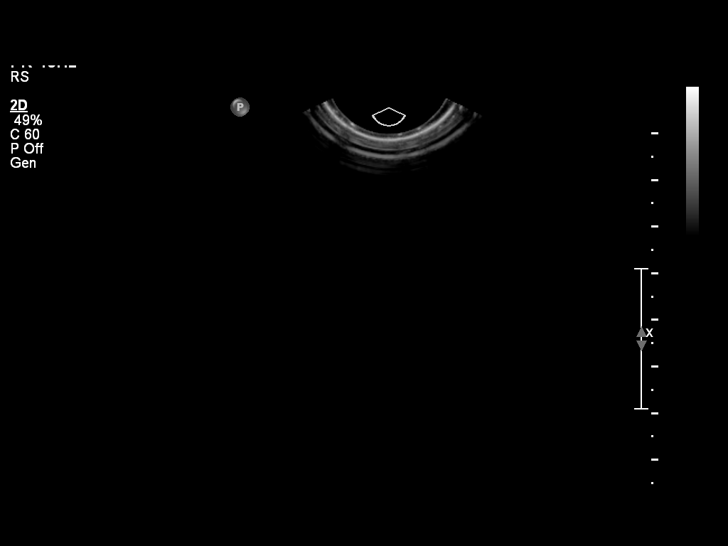
[im 3/21]
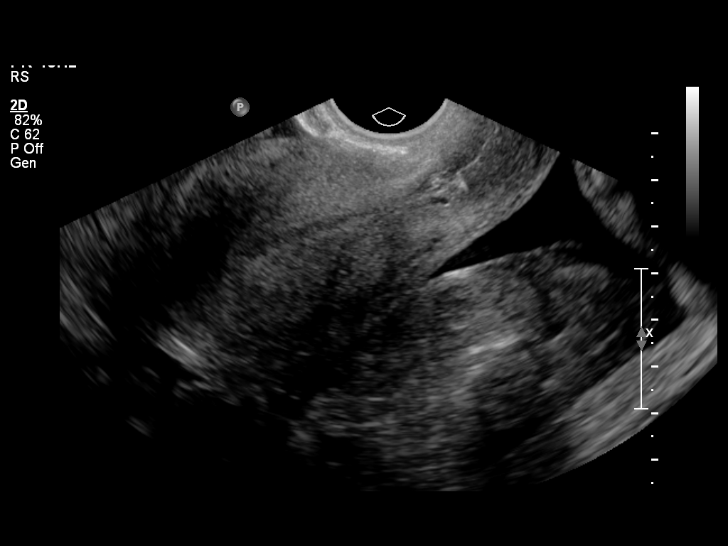
[im 5/21]
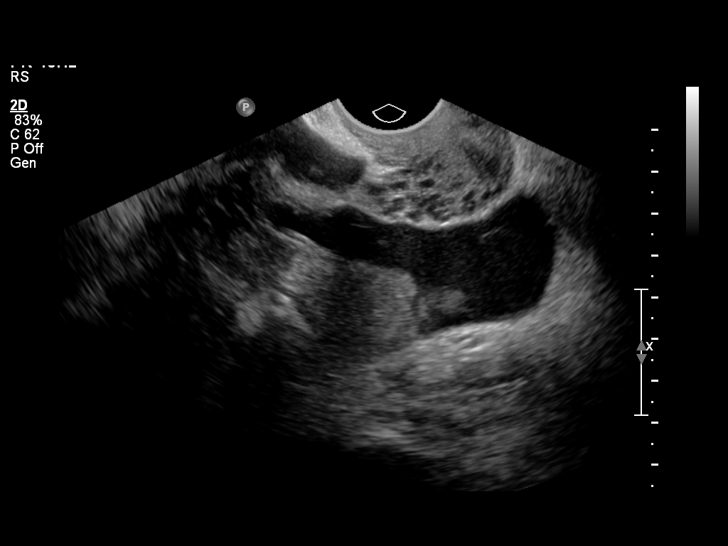
[im 6/21]
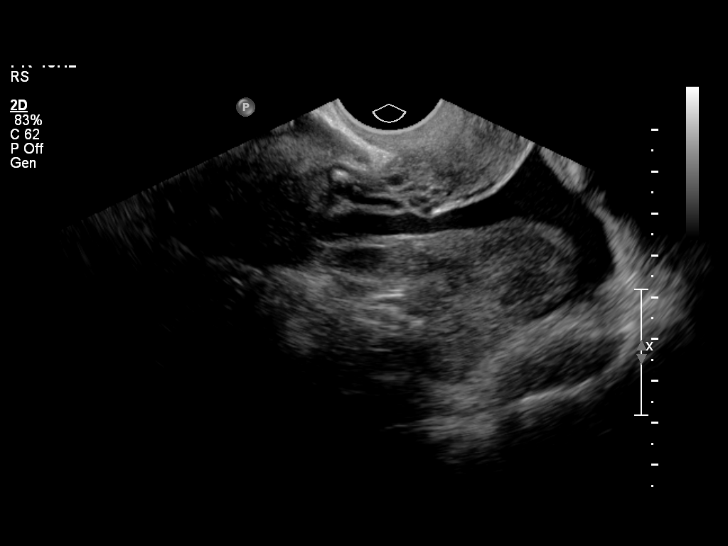
[im 8/21]
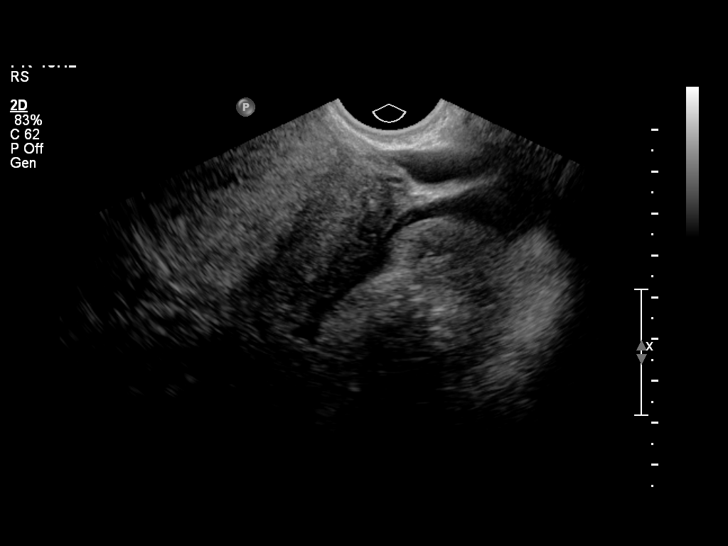
[im 9/21]
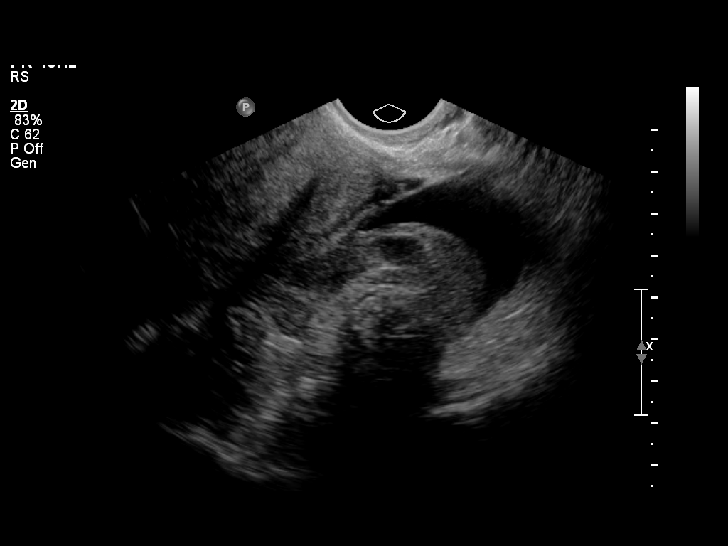
[im 11/21]
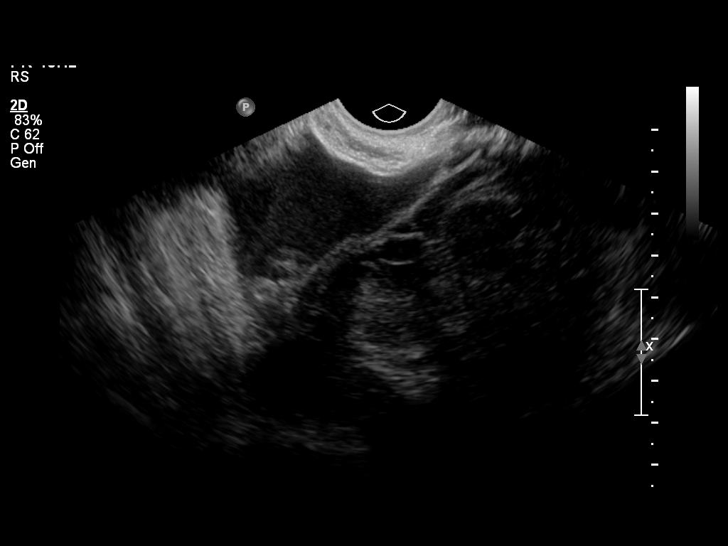
[im 13/21]
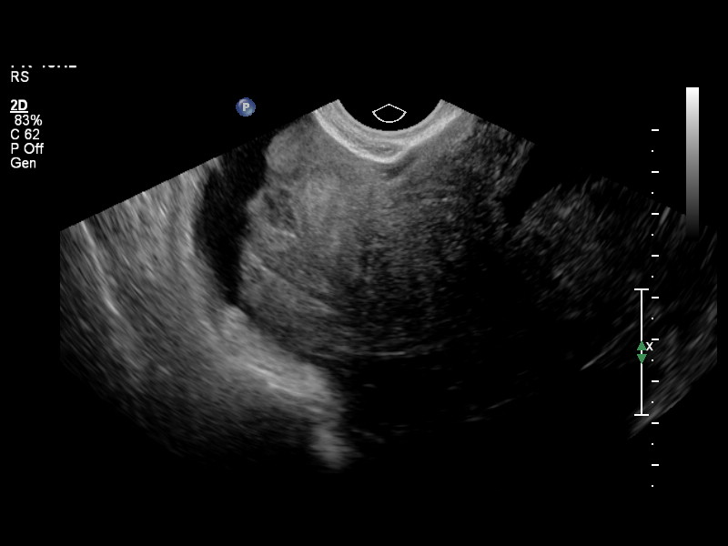
[im 14/21]
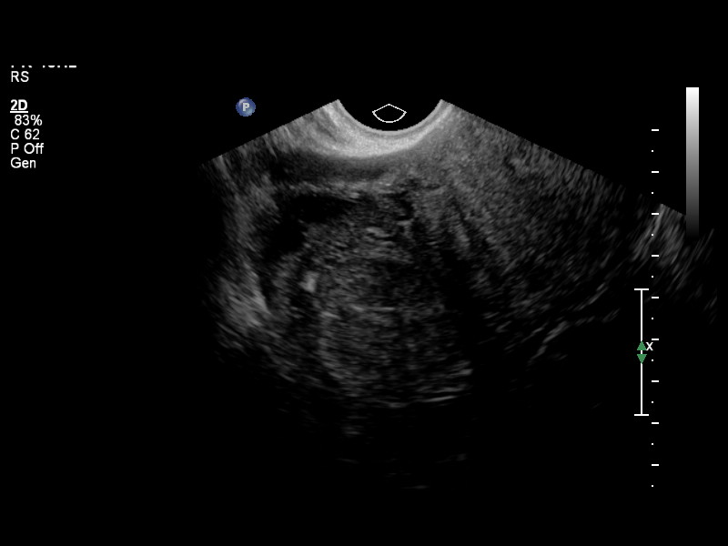
[im 16/21]
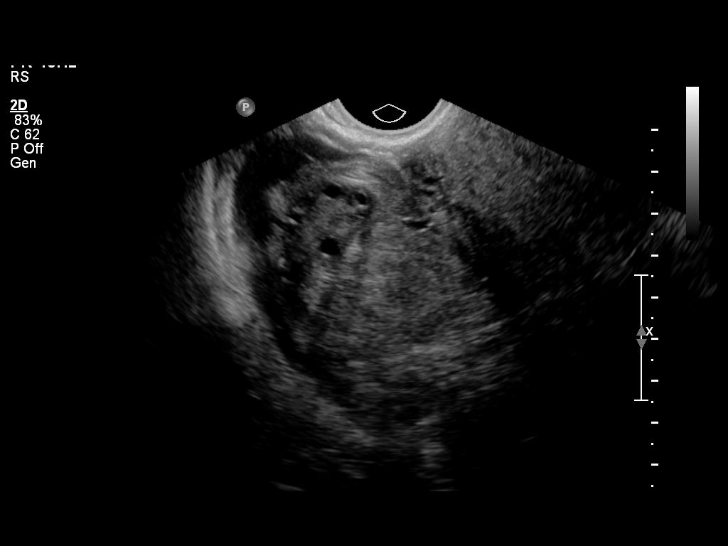
[im 17/21]
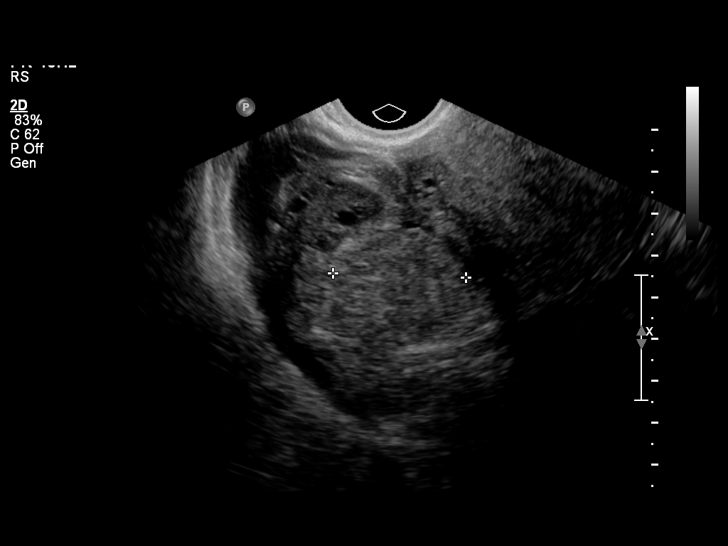
[im 19/21]
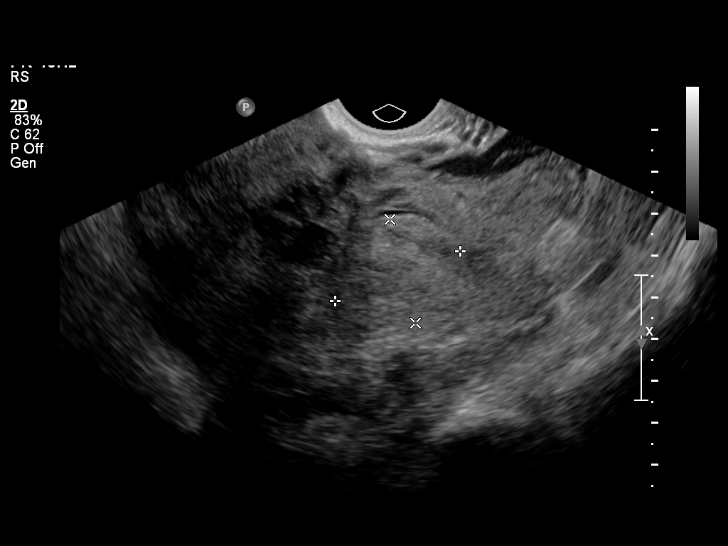
[im 21/21]
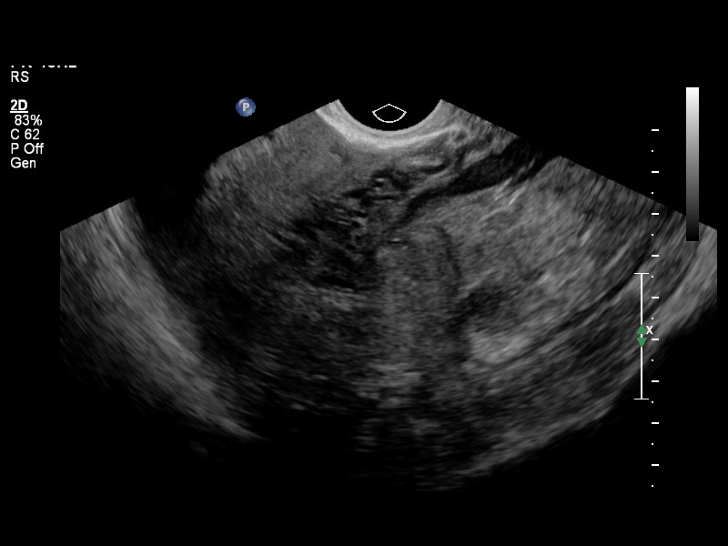

[13 of 21 positions shown; findings below may reference images not displayed]

FINDINGS: Intrauterine gestational sac: None visualized

Yolk sac:  Not visualize

Embryo:  Not visualized

Maternal uterus/adnexae: The ring de cystic structure previously
seen in the right adnexa is no longer visualized. There is an
increasing heterogeneous, complex echogenic masslike area in this
region which measures 3.2 x 3.2 x 2.6 cm. In addition, increasing
complex free fluid noted in the pelvis, now a large. Findings are
concerning for ruptured ectopic pregnancy. The patient was
significantly tender during the study.
IMPRESSION: Increasing complex heterogeneous echogenic area in the previous
region of the right ectopic pregnancy. Increasing free fluid
throughout the pelvis. Findings concerning for ruptured ectopic
pregnancy.

Critical Value/emergent results were called by telephone at the time
of interpretation on 07/21/2013 at [DATE] to MIRSABIR DALOWAR , who
verbally acknowledged these results.

## 2014-09-19 ENCOUNTER — Other Ambulatory Visit: Payer: Self-pay | Admitting: Obstetrics and Gynecology

## 2014-09-20 LAB — CYTOLOGY - PAP

## 2015-03-24 NOTE — L&D Delivery Note (Signed)
Delivery Note At 4:50 PM a viable female was delivered via Vaginal, Spontaneous Delivery (PresentationOA: ;  ).  APGAR:9/9 , ; weight  .   Placenta status:spont>>intact , .  Cord:  with the following complications: loose nuchal X 1.  Cord pH: not sent  Anesthesia: Epidural  Episiotomy:  none Lacerations:  First deg Suture Repair: 3.0 vicryl rapide Est. Blood Loss (mL):  200  Mom to postpartum.  Baby to Couplet care / Skin to Skin.  Margarette Asal 04/15/2015, 5:02 PM

## 2015-03-25 ENCOUNTER — Encounter (HOSPITAL_COMMUNITY): Payer: Self-pay | Admitting: *Deleted

## 2015-03-25 ENCOUNTER — Inpatient Hospital Stay (HOSPITAL_COMMUNITY)
Admission: AD | Admit: 2015-03-25 | Discharge: 2015-03-26 | Disposition: A | Discharge status: Home / Self Care | Payer: 59 | Source: Ambulatory Visit | Attending: Obstetrics & Gynecology | Admitting: Obstetrics & Gynecology

## 2015-03-25 DIAGNOSIS — Z3A37 37 weeks gestation of pregnancy: Secondary | ICD-10-CM | POA: Diagnosis not present

## 2015-03-25 DIAGNOSIS — O26893 Other specified pregnancy related conditions, third trimester: Secondary | ICD-10-CM | POA: Insufficient documentation

## 2015-03-25 DIAGNOSIS — O9989 Other specified diseases and conditions complicating pregnancy, childbirth and the puerperium: Secondary | ICD-10-CM

## 2015-03-25 DIAGNOSIS — J029 Acute pharyngitis, unspecified: Secondary | ICD-10-CM | POA: Diagnosis present

## 2015-03-25 DIAGNOSIS — R0981 Nasal congestion: Secondary | ICD-10-CM | POA: Insufficient documentation

## 2015-03-25 DIAGNOSIS — R05 Cough: Secondary | ICD-10-CM | POA: Diagnosis not present

## 2015-03-25 DIAGNOSIS — J111 Influenza due to unidentified influenza virus with other respiratory manifestations: Secondary | ICD-10-CM | POA: Diagnosis not present

## 2015-03-25 LAB — CBC WITH DIFFERENTIAL/PLATELET
Basophils Absolute: 0 10*3/uL (ref 0.0–0.1)
Basophils Relative: 0 %
Eosinophils Absolute: 0 10*3/uL (ref 0.0–0.7)
Eosinophils Relative: 0 %
HCT: 33.6 % — ABNORMAL LOW (ref 36.0–46.0)
Hemoglobin: 11.2 g/dL — ABNORMAL LOW (ref 12.0–15.0)
Lymphocytes Relative: 8 %
Lymphs Abs: 1 10*3/uL (ref 0.7–4.0)
MCH: 28.9 pg (ref 26.0–34.0)
MCHC: 33.3 g/dL (ref 30.0–36.0)
MCV: 86.8 fL (ref 78.0–100.0)
Monocytes Absolute: 0.8 10*3/uL (ref 0.1–1.0)
Monocytes Relative: 7 %
Neutro Abs: 10.5 10*3/uL — ABNORMAL HIGH (ref 1.7–7.7)
Neutrophils Relative %: 85 %
Platelets: 260 10*3/uL (ref 150–400)
RBC: 3.87 MIL/uL (ref 3.87–5.11)
RDW: 13.5 % (ref 11.5–15.5)
WBC: 12.4 10*3/uL — ABNORMAL HIGH (ref 4.0–10.5)

## 2015-03-25 LAB — URINALYSIS, ROUTINE W REFLEX MICROSCOPIC
Bilirubin Urine: NEGATIVE
Glucose, UA: NEGATIVE mg/dL
Hgb urine dipstick: NEGATIVE
Ketones, ur: 15 mg/dL — AB
Leukocytes, UA: NEGATIVE
Nitrite: NEGATIVE
Protein, ur: NEGATIVE mg/dL
Specific Gravity, Urine: 1.01 (ref 1.005–1.030)
pH: 8 (ref 5.0–8.0)

## 2015-03-25 MED ORDER — OSELTAMIVIR PHOSPHATE 75 MG PO CAPS: 75.0000 mg | ORAL_CAPSULE | Freq: Two times a day (BID) | ORAL | Status: DC

## 2015-03-25 MED ORDER — OSELTAMIVIR PHOSPHATE 75 MG PO CAPS
75.0000 mg | ORAL_CAPSULE | Freq: Once | ORAL | Status: AC
  Administered 2015-03-25: 75 mg via ORAL
  Filled 2015-03-25: qty 1

## 2015-03-25 MED ORDER — LACTATED RINGERS IV BOLUS (SEPSIS): 1000.0000 mL | Freq: Once | INTRAVENOUS | Status: DC

## 2015-03-25 MED ORDER — ACETAMINOPHEN 500 MG PO TABS
1000.0000 mg | ORAL_TABLET | Freq: Once | ORAL | Status: AC
  Administered 2015-03-25: 1000 mg via ORAL
  Filled 2015-03-25: qty 2

## 2015-03-25 NOTE — MAU Note (Signed)
Pt reports sore throat, fever, cough, decreased fetal movement.

## 2015-03-25 NOTE — MAU Provider Note (Signed)
History     CSN: [id number]  Arrival date and time: 03/25/15 2211   First Provider Initiated Contact with Patient 03/25/15 2244      Chief Complaint  Patient presents with  . Cough  . Fever   HPI  Anita Wiggins is a 30 y.o. G4P1001 at [redacted]w[redacted]d who presents to MAU today with complaint of cough, sore throat, nasal congestion, fever and body aches since yesterday. She states axillary temperature of 101.2 F today. She states only slight sputum production. She denies chest pain, abdominal pain, vaginal bleeding or LOF. She noted a slight decrease in fetal movement earlier today, but feels normal movement while in MAU. She did get her flu shot in October.   OB History    Gravida Para Term Preterm AB TAB SAB Ectopic Multiple Living   4 1 1       1       Past Medical History  Diagnosis Date  . No pertinent past medical history   . Shingles     Past Surgical History  Procedure Laterality Date  . No past surgeries    . Umbilical hernia repair    . Diagnostic laparoscopy with removal of ectopic pregnancy Right 07/21/2013    Procedure: DIAGNOSTIC LAPAROSCOPY WITH REMOVAL OF ECTOPIC PREGNANCY;  Surgeon: Linda Hedges, DO;  Location: [facility];  Service: Gynecology;  Laterality: Right;    Family History  Problem Relation Age of Onset  . Heart disease Maternal Grandfather     Social History  Substance Use Topics  . Smoking status: Never Smoker   . Smokeless tobacco: Never Used  . Alcohol Use: No    Allergies: No Known Allergies  Prescriptions prior to admission  Medication Sig Dispense Refill Last Dose  . acetaminophen (TYLENOL) 500 MG tablet Take 1,000 mg by mouth every 6 (six) hours as needed for headache.   Past Week at Unknown time  . ibuprofen (ADVIL) 600 MG tablet Take 1 tablet (600 mg total) by mouth every 6 (six) hours as needed. 30 tablet 0   . oxyCODONE-acetaminophen (PERCOCET) 7.5-325 MG per tablet Take 1 tablet by mouth every 4 (four) hours as needed for pain. 30 tablet  0     Review of Systems  Constitutional: Positive for fever. Negative for malaise/fatigue.  HENT: Positive for congestion and sore throat. Negative for ear discharge and ear pain.   Respiratory: Positive for cough and sputum production. Negative for shortness of breath.   Cardiovascular: Negative for chest pain.  Gastrointestinal: Negative for abdominal pain.  Genitourinary:       Neg - vaginal bleeding, LOF  Neurological: Positive for headaches.   Physical Exam   Blood pressure 116/69, pulse 108, temperature 99.7 F (37.6 C), temperature source Oral, resp. rate 20, last menstrual period 05/19/2013, unknown if currently breastfeeding.  Physical Exam  Nursing note and vitals reviewed. Constitutional: She is oriented to person, place, and time. She appears well-developed and well-nourished. No distress.  HENT:  Head: Normocephalic and atraumatic.  Nose: Mucosal edema and rhinorrhea present. Right sinus exhibits no maxillary sinus tenderness and no frontal sinus tenderness. Left sinus exhibits no maxillary sinus tenderness and no frontal sinus tenderness.  Mouth/Throat: Posterior oropharyngeal erythema present. No oropharyngeal exudate or posterior oropharyngeal edema.  Eyes: EOM are normal.  Neck: Normal range of motion.  Cardiovascular: Regular rhythm and normal heart sounds.  Tachycardia present.   Respiratory: Effort normal and breath sounds normal. No respiratory distress. She has no wheezes.  GI: Soft.  She exhibits no distension.  Lymphadenopathy:    She has no cervical adenopathy.  Neurological: She is alert and oriented to person, place, and time.  Skin: Skin is warm and dry. No erythema.  Psychiatric: She has a normal mood and affect.    Results for orders placed or performed during the hospital encounter of 03/25/15 (from the past 24 hour(s))  Urinalysis, Routine w reflex microscopic (not at [facility])     Status: Abnormal   Collection Time: 03/25/15 10:35 PM  Result Value  Ref Range   Color, Urine YELLOW YELLOW   APPearance CLEAR CLEAR   Specific Gravity, Urine 1.010 1.005 - 1.030   pH 8.0 5.0 - 8.0   Glucose, UA NEGATIVE NEGATIVE mg/dL   Hgb urine dipstick NEGATIVE NEGATIVE   Bilirubin Urine NEGATIVE NEGATIVE   Ketones, ur 15 (A) NEGATIVE mg/dL   Protein, ur NEGATIVE NEGATIVE mg/dL   Nitrite NEGATIVE NEGATIVE   Leukocytes, UA NEGATIVE NEGATIVE  Rapid strep screen (not at [facility])     Status: None   Collection Time: 03/25/15 10:45 PM  Result Value Ref Range   Streptococcus, Group A Screen (Direct) NEGATIVE NEGATIVE  CBC with Differential/Platelet     Status: Abnormal   Collection Time: 03/25/15 10:52 PM  Result Value Ref Range   WBC 12.4 (H) 4.0 - 10.5 K/uL   RBC 3.87 3.87 - 5.11 MIL/uL   Hemoglobin 11.2 (L) 12.0 - 15.0 g/dL   HCT 33.6 (L) 36.0 - 46.0 %   MCV 86.8 78.0 - 100.0 fL   MCH 28.9 26.0 - 34.0 pg   MCHC 33.3 30.0 - 36.0 g/dL   RDW 13.5 11.5 - 15.5 %   Platelets 260 150 - 400 K/uL   Neutrophils Relative % 85 %   Neutro Abs 10.5 (H) 1.7 - 7.7 K/uL   Lymphocytes Relative 8 %   Lymphs Abs 1.0 0.7 - 4.0 K/uL   Monocytes Relative 7 %   Monocytes Absolute 0.8 0.1 - 1.0 K/uL   Eosinophils Relative 0 %   Eosinophils Absolute 0.0 0.0 - 0.7 K/uL   Basophils Relative 0 %   Basophils Absolute 0.0 0.0 - 0.1 K/uL    Fetal Monitoring: Baseline: 170 bpm initially, IV fluid bolus given - 150 bpm Variability: moderate Accelerations: 25 x 25 Decelerations: none Contractions: none, moderate UI  MAU Course  Procedures None  MDM IV LR bolus initiated due to fetal tachyardia UA, CBC, flu swab and rapid strep obtained Discussed with Dr. Lynnette Caffey. Agrees with plan for treatment with Tamiflu given delayed result time. Advised increased rest and hydration. She will have RN from the office call with follow-up in a few days.  First dose of Tamiflu and 1000 mg Tylenol given in MAU for temperature of 101 F Temperature at time of discharge is 99.7  F Assessment and Plan  A: SIUP at [redacted]w[redacted]d Possible influenza  P: Discharge home Rx for Tamiflu given to patient Influenza testing pending at time of discharge Patient advised to increase PO hydration and rest  Warning signs for worsening condition discussed Patient advised to follow-up with [facility] as scheduled or sooner PRN Patient may return to MAU as needed or if her condition were to change or worsen   Luvenia Redden, PA-C  03/26/2015, 12:25 AM

## 2015-03-25 NOTE — Discharge Instructions (Signed)
Influenza, Adult Influenza (flu) is an infection in the mouth, nose, and throat (respiratory tract) caused by a virus. The flu can make you feel very ill. Influenza spreads easily from person to person (contagious).  HOME CARE   Only take medicines as told by your doctor.  Use a cool mist humidifier to make breathing easier.  Get plenty of rest until your fever goes away. This usually takes 3 to 4 days.  Drink enough fluids to keep your pee (urine) clear or pale yellow.  Cover your mouth and nose when you cough or sneeze.  Wash your hands well to avoid spreading the flu.  Stay home from work or school until your fever has been gone for at least 1 full day.  Get a flu shot every year. GET HELP RIGHT AWAY IF:   You have trouble breathing or feel short of breath.  Your skin or nails turn blue.  You have severe neck pain or stiffness.  You have a severe headache, facial pain, or earache.  Your fever gets worse or keeps coming back.  You feel sick to your stomach (nauseous), throw up (vomit), or have watery poop (diarrhea).  You have chest pain.  You have a deep cough that gets worse, or you cough up more thick spit (mucus). MAKE SURE YOU:   Understand these instructions.  Will watch your condition.  Will get help right away if you are not doing well or get worse.   This information is not intended to replace advice given to you by your health care provider. Make sure you discuss any questions you have with your health care provider.   Document Released: 12/17/2007 Document Revised: 03/30/2014 Document Reviewed: 06/08/2011 Elsevier Interactive Patient Education 2016 [employer]vier Inc.  

## 2015-03-26 ENCOUNTER — Encounter (HOSPITAL_COMMUNITY): Payer: Self-pay | Admitting: *Deleted

## 2015-03-26 DIAGNOSIS — O26893 Other specified pregnancy related conditions, third trimester: Secondary | ICD-10-CM | POA: Diagnosis not present

## 2015-03-26 DIAGNOSIS — J029 Acute pharyngitis, unspecified: Secondary | ICD-10-CM | POA: Diagnosis not present

## 2015-03-26 DIAGNOSIS — R05 Cough: Secondary | ICD-10-CM | POA: Diagnosis not present

## 2015-03-26 DIAGNOSIS — Z3A37 37 weeks gestation of pregnancy: Secondary | ICD-10-CM | POA: Diagnosis not present

## 2015-03-26 DIAGNOSIS — R0981 Nasal congestion: Secondary | ICD-10-CM | POA: Diagnosis not present

## 2015-03-26 LAB — RAPID STREP SCREEN (MED CTR MEBANE ONLY): Streptococcus, Group A Screen (Direct): NEGATIVE

## 2015-03-26 LAB — INFLUENZA PANEL BY PCR (TYPE A & B)
H1N1 flu by pcr: NOT DETECTED
Influenza A By PCR: POSITIVE — AB
Influenza B By PCR: NEGATIVE

## 2015-03-28 LAB — CULTURE, GROUP A STREP: Strep A Culture: NEGATIVE

## 2015-04-07 LAB — OB RESULTS CONSOLE GBS: GBS: NEGATIVE

## 2015-04-11 ENCOUNTER — Telehealth (HOSPITAL_COMMUNITY): Payer: Self-pay | Admitting: *Deleted

## 2015-04-11 ENCOUNTER — Encounter (HOSPITAL_COMMUNITY): Payer: Self-pay | Admitting: *Deleted

## 2015-04-11 NOTE — Telephone Encounter (Signed)
Preadmission screen  

## 2015-04-15 ENCOUNTER — Encounter (HOSPITAL_COMMUNITY): Payer: Self-pay

## 2015-04-15 ENCOUNTER — Inpatient Hospital Stay (HOSPITAL_COMMUNITY): Payer: 59 | Admitting: Anesthesiology

## 2015-04-15 ENCOUNTER — Inpatient Hospital Stay (HOSPITAL_COMMUNITY)
Admission: RE | Admit: 2015-04-15 | Discharge: 2015-04-17 | DRG: 775 | Disposition: A | Discharge status: Home / Self Care | Payer: 59 | Source: Ambulatory Visit | Attending: Obstetrics & Gynecology | Admitting: Obstetrics & Gynecology

## 2015-04-15 DIAGNOSIS — Z8249 Family history of ischemic heart disease and other diseases of the circulatory system: Secondary | ICD-10-CM

## 2015-04-15 DIAGNOSIS — Z833 Family history of diabetes mellitus: Secondary | ICD-10-CM | POA: Diagnosis not present

## 2015-04-15 DIAGNOSIS — Z3A40 40 weeks gestation of pregnancy: Secondary | ICD-10-CM | POA: Diagnosis not present

## 2015-04-15 DIAGNOSIS — Z3490 Encounter for supervision of normal pregnancy, unspecified, unspecified trimester: Secondary | ICD-10-CM

## 2015-04-15 LAB — CBC
HCT: 35.4 % — ABNORMAL LOW (ref 36.0–46.0)
Hemoglobin: 11.6 g/dL — ABNORMAL LOW (ref 12.0–15.0)
MCH: 27.9 pg (ref 26.0–34.0)
MCHC: 32.8 g/dL (ref 30.0–36.0)
MCV: 85.1 fL (ref 78.0–100.0)
Platelets: 302 10*3/uL (ref 150–400)
RBC: 4.16 MIL/uL (ref 3.87–5.11)
RDW: 13.8 % (ref 11.5–15.5)
WBC: 8.5 10*3/uL (ref 4.0–10.5)

## 2015-04-15 LAB — TYPE AND SCREEN
ABO/RH(D): O POS
Antibody Screen: NEGATIVE

## 2015-04-15 LAB — RPR: RPR Ser Ql: NONREACTIVE

## 2015-04-15 MED ORDER — LACTATED RINGERS IV SOLN
INTRAVENOUS | Status: DC
  Administered 2015-04-15 (×2): via INTRAVENOUS

## 2015-04-15 MED ORDER — ONDANSETRON HCL 4 MG PO TABS: 4.0000 mg | ORAL_TABLET | ORAL | Status: DC | PRN

## 2015-04-15 MED ORDER — OXYTOCIN BOLUS FROM INFUSION: 500.0000 mL | INTRAVENOUS | Status: DC

## 2015-04-15 MED ORDER — OXYTOCIN 10 UNIT/ML IJ SOLN
2.5000 [IU]/h | INTRAVENOUS | Status: DC
  Administered 2015-04-15: 999 m[IU]/min via INTRAVENOUS
  Filled 2015-04-15: qty 4

## 2015-04-15 MED ORDER — SIMETHICONE 80 MG PO CHEW: 80.0000 mg | CHEWABLE_TABLET | ORAL | Status: DC | PRN

## 2015-04-15 MED ORDER — MEASLES, MUMPS & RUBELLA VAC ~~LOC~~ INJ: 0.5000 mL | INJECTION | Freq: Once | SUBCUTANEOUS | Status: DC

## 2015-04-15 MED ORDER — PHENYLEPHRINE 40 MCG/ML (10ML) SYRINGE FOR IV PUSH (FOR BLOOD PRESSURE SUPPORT)
80.0000 ug | PREFILLED_SYRINGE | INTRAVENOUS | Status: DC | PRN
  Filled 2015-04-15: qty 20
  Filled 2015-04-15: qty 2

## 2015-04-15 MED ORDER — ONDANSETRON HCL 4 MG/2ML IJ SOLN: 4.0000 mg | Freq: Four times a day (QID) | INTRAMUSCULAR | Status: DC | PRN

## 2015-04-15 MED ORDER — CITRIC ACID-SODIUM CITRATE 334-500 MG/5ML PO SOLN: 30.0000 mL | ORAL | Status: DC | PRN

## 2015-04-15 MED ORDER — SENNOSIDES-DOCUSATE SODIUM 8.6-50 MG PO TABS
2.0000 | ORAL_TABLET | ORAL | Status: DC
  Administered 2015-04-15 – 2015-04-16 (×2): 2 via ORAL
  Filled 2015-04-15 (×2): qty 2

## 2015-04-15 MED ORDER — BENZOCAINE-MENTHOL 20-0.5 % EX AERO
1.0000 "application " | INHALATION_SPRAY | CUTANEOUS | Status: DC | PRN
  Administered 2015-04-16: 1 via TOPICAL
  Filled 2015-04-15: qty 56

## 2015-04-15 MED ORDER — EPHEDRINE 5 MG/ML INJ
10.0000 mg | INTRAVENOUS | Status: DC | PRN
  Filled 2015-04-15: qty 2

## 2015-04-15 MED ORDER — LANOLIN HYDROUS EX OINT: TOPICAL_OINTMENT | CUTANEOUS | Status: DC | PRN

## 2015-04-15 MED ORDER — PRENATAL MULTIVITAMIN CH
1.0000 | ORAL_TABLET | Freq: Every day | ORAL | Status: DC
  Administered 2015-04-16: 1 via ORAL
  Filled 2015-04-15: qty 1

## 2015-04-15 MED ORDER — BISACODYL 10 MG RE SUPP: 10.0000 mg | Freq: Every day | RECTAL | Status: DC | PRN

## 2015-04-15 MED ORDER — OXYCODONE-ACETAMINOPHEN 5-325 MG PO TABS
2.0000 | ORAL_TABLET | ORAL | Status: DC | PRN
Start: 2015-04-15 — End: 2015-04-15

## 2015-04-15 MED ORDER — DIBUCAINE 1 % RE OINT: 1.0000 "application " | TOPICAL_OINTMENT | RECTAL | Status: DC | PRN

## 2015-04-15 MED ORDER — LIDOCAINE HCL (PF) 1 % IJ SOLN
30.0000 mL | INTRAMUSCULAR | Status: DC | PRN
  Filled 2015-04-15: qty 30

## 2015-04-15 MED ORDER — LIDOCAINE HCL (PF) 1 % IJ SOLN
INTRAMUSCULAR | Status: DC | PRN
  Administered 2015-04-15: 4 mL
  Administered 2015-04-15: 6 mL via EPIDURAL

## 2015-04-15 MED ORDER — FENTANYL 2.5 MCG/ML BUPIVACAINE 1/10 % EPIDURAL INFUSION (WH - ANES)
14.0000 mL/h | INTRAMUSCULAR | Status: DC | PRN
  Administered 2015-04-15: 16 mL/h via EPIDURAL
  Filled 2015-04-15: qty 125

## 2015-04-15 MED ORDER — DIPHENHYDRAMINE HCL 25 MG PO CAPS: 25.0000 mg | ORAL_CAPSULE | Freq: Four times a day (QID) | ORAL | Status: DC | PRN

## 2015-04-15 MED ORDER — ACETAMINOPHEN 325 MG PO TABS: 650.0000 mg | ORAL_TABLET | ORAL | Status: DC | PRN

## 2015-04-15 MED ORDER — FLEET ENEMA 7-19 GM/118ML RE ENEM: 1.0000 | ENEMA | Freq: Every day | RECTAL | Status: DC | PRN

## 2015-04-15 MED ORDER — OXYTOCIN 10 UNIT/ML IJ SOLN
1.0000 m[IU]/min | INTRAVENOUS | Status: DC
  Administered 2015-04-15: 1 m[IU]/min via INTRAVENOUS

## 2015-04-15 MED ORDER — WITCH HAZEL-GLYCERIN EX PADS: 1.0000 "application " | MEDICATED_PAD | CUTANEOUS | Status: DC | PRN

## 2015-04-15 MED ORDER — ZOLPIDEM TARTRATE 5 MG PO TABS: 5.0000 mg | ORAL_TABLET | Freq: Every evening | ORAL | Status: DC | PRN

## 2015-04-15 MED ORDER — DIPHENHYDRAMINE HCL 50 MG/ML IJ SOLN: 12.5000 mg | INTRAMUSCULAR | Status: DC | PRN

## 2015-04-15 MED ORDER — ONDANSETRON HCL 4 MG/2ML IJ SOLN
4.0000 mg | INTRAMUSCULAR | Status: DC | PRN
Start: 2015-04-15 — End: 2015-04-17

## 2015-04-15 MED ORDER — TETANUS-DIPHTH-ACELL PERTUSSIS 5-2.5-18.5 LF-MCG/0.5 IM SUSP: 0.5000 mL | Freq: Once | INTRAMUSCULAR | Status: DC

## 2015-04-15 MED ORDER — ACETAMINOPHEN 325 MG PO TABS
650.0000 mg | ORAL_TABLET | ORAL | Status: DC | PRN
  Administered 2015-04-16 (×2): 650 mg via ORAL
  Filled 2015-04-15 (×3): qty 2

## 2015-04-15 MED ORDER — IBUPROFEN 800 MG PO TABS
800.0000 mg | ORAL_TABLET | Freq: Three times a day (TID) | ORAL | Status: DC | PRN
  Administered 2015-04-15 – 2015-04-16 (×3): 800 mg via ORAL
  Filled 2015-04-15 (×3): qty 1

## 2015-04-15 MED ORDER — FLEET ENEMA 7-19 GM/118ML RE ENEM: 1.0000 | ENEMA | RECTAL | Status: DC | PRN

## 2015-04-15 MED ORDER — OXYCODONE-ACETAMINOPHEN 5-325 MG PO TABS
1.0000 | ORAL_TABLET | ORAL | Status: DC | PRN
Start: 2015-04-15 — End: 2015-04-15

## 2015-04-15 MED ORDER — LACTATED RINGERS IV SOLN
500.0000 mL | INTRAVENOUS | Status: DC | PRN
  Administered 2015-04-15: 1000 mL via INTRAVENOUS

## 2015-04-15 MED ORDER — TERBUTALINE SULFATE 1 MG/ML IJ SOLN
0.2500 mg | Freq: Once | INTRAMUSCULAR | Status: DC | PRN
Start: 2015-04-15 — End: 2015-04-15
  Filled 2015-04-15: qty 1

## 2015-04-15 NOTE — H&P (Signed)
Anita Wiggins is a 30 y.o. female presenting for IOL. Maternal Medical History:  Fetal activity: Perceived fetal activity is normal.   Last perceived fetal movement was within the past hour.      OB History    Gravida Para Term Preterm AB TAB SAB Ectopic Multiple Living   4 1 1  2  1 1  1      Past Medical History  Diagnosis Date  . No pertinent past medical history   . Shingles   . Hx of varicella    Past Surgical History  Procedure Laterality Date  . No past surgeries    . Umbilical hernia repair    . Diagnostic laparoscopy with removal of ectopic pregnancy Right 07/21/2013    Procedure: DIAGNOSTIC LAPAROSCOPY WITH REMOVAL OF ECTOPIC PREGNANCY;  Surgeon: Linda Hedges, DO;  Location: [facility];  Service: Gynecology;  Laterality: Right;   Family History: family history includes Cancer in her maternal grandfather; Congestive Heart Failure in her maternal grandfather; Diabetes in her paternal uncle; Heart disease in her maternal grandfather. Social History:  reports that she has never smoked. She has never used smokeless tobacco. She reports that she does not drink alcohol or use illicit drugs.   Prenatal Transfer Tool  Maternal Diabetes: No Genetic Screening: Normal Maternal Ultrasounds/Referrals: Normal Fetal Ultrasounds or other Referrals:  None Maternal Substance Abuse:  No Significant Maternal Medications:  None Significant Maternal Lab Results:  None Other Comments:  None  ROS  Dilation: 2 Effacement (%): Thick Exam by:: Allayah Raineri Blood pressure 116/81, pulse 93, resp. rate 20, height 5\' 9"  (1.753 m), weight 158 lb (71.668 kg), last menstrual period 05/19/2013, unknown if currently breastfeeding. Exam Physical Exam  Constitutional: She is oriented to person, place, and time. She appears well-developed and well-nourished.  HENT:  Head: Normocephalic and atraumatic.  Neck: Neck supple.  Cardiovascular: Normal rate and regular rhythm.   Respiratory: Effort normal and  breath sounds normal.  GI:  Term FH/ FHR 142  Genitourinary:  2/thk/post/AROM w/ISE>>clr  Musculoskeletal: Normal range of motion.  Neurological: She is oriented to person, place, and time.    Prenatal labs: ABO, Rh: O/Positive/-- (06/15 0000) Antibody: Negative (06/15 0000) Rubella: Immune (06/15 0000) RPR: Nonreactive (06/15 0000)  HBsAg: Negative (06/15 0000)  HIV: Non-reactive (06/15 0000)  GBS: Negative (01/15 0000)   Assessment/Plan: IOL@term    Hailey Miles M 04/15/2015, 8:04 AM

## 2015-04-15 NOTE — Anesthesia Procedure Notes (Signed)
Epidural Patient location during procedure: OB  Preanesthetic Checklist Completed: patient identified, site marked, surgical consent, pre-op evaluation, timeout performed, IV checked, risks and benefits discussed and monitors and equipment checked  Epidural Patient position: sitting Prep: site prepped and draped and DuraPrep Patient monitoring: continuous pulse ox and blood pressure Approach: midline Location: L2-L3 Injection technique: LOR air  Needle:  Needle type: Tuohy  Needle gauge: 17 G Needle length: 9 cm and 9 Needle insertion depth: 5 cm cm Catheter type: closed end flexible Catheter size: 19 Gauge Catheter at skin depth: 10 cm Test dose: negative  Assessment Events: blood not aspirated, injection not painful, no injection resistance, negative IV test and no paresthesia  Additional Notes Dosing of Epidural:  1st dose, through catheter ............................................Marland Kitchen  Xylocaine 40 mg  2nd dose, through catheter, after waiting 3 minutes........Marland KitchenXylocaine 60 mg    As each dose occurred, patient was free of IV sx; and patient exhibited no evidence of SA injection.  Patient is more comfortable after epidural dosed. Please see RN's note for documentation of vital signs,and FHR which are stable.  Patient reminded not to try to ambulate with numb legs, and that an RN must be present when she attempts to get up.

## 2015-04-15 NOTE — Anesthesia Preprocedure Evaluation (Signed)

## 2015-04-15 NOTE — Progress Notes (Signed)
Now C/C/+1, pit on just 45mu/min, FHR catI, will start pushing effort

## 2015-04-16 LAB — CBC
HCT: 29.5 % — ABNORMAL LOW (ref 36.0–46.0)
Hemoglobin: 9.7 g/dL — ABNORMAL LOW (ref 12.0–15.0)
MCH: 28 pg (ref 26.0–34.0)
MCHC: 32.9 g/dL (ref 30.0–36.0)
MCV: 85.3 fL (ref 78.0–100.0)
Platelets: 252 10*3/uL (ref 150–400)
RBC: 3.46 MIL/uL — ABNORMAL LOW (ref 3.87–5.11)
RDW: 13.8 % (ref 11.5–15.5)
WBC: 11.8 10*3/uL — ABNORMAL HIGH (ref 4.0–10.5)

## 2015-04-16 LAB — CCBB MATERNAL DONOR DRAW

## 2015-04-16 NOTE — Lactation Note (Signed)
This note was copied from the chart of [place]. Lactation Consultation Note  Patient Name: Boy Leiah Hourani [id number] Date: 04/16/2015 Reason for consult: Follow-up assessment;Breast/nipple pain;Difficult latch   Called to room for feeding assistance. Mom had just fed infant EBM 3 cc via curved tip syringe. Infant alert and cueing to feed. Infant sucked on gloved finger. He demonstrated rhythmic sucking with tongue extended over gum line and cupping of finger. He does not appear to have tongue restriction on this exam. Infant does have a mildly recessed chin. Infant latched to left breast by mom in football hold, he does not open wide and needs assistance to flange lips once latched. Enc mom to pull infant a little closer into breast with feeding. A few intermittent swallows were noted. He nursed for 5 minutes when Mom reported pain with feeding that tends to worsen the longer the infant feeds. Mom is aware to break suction before taking infant off. Infant was then placed to breast using # 16 NS. He demonstrated rhythmic sucking but needed lips flanged again. There were intermittent swallows noted that increased with compression of the breast. He does not open his mouth wide with latches. Mom was able to tolerate infant on breast for 10 more minutes at which time the pain increased and he was removed from the breast. Mom's nipple was pulled into NS and was noted to be pinched once NS removed. There was milk in NS. Mom with soft breasts and everted nipples that are both reddened. Right redder than left. Mom is using comfort gels, Breast shells, and EBM to nipples. Mom reports this is how feedings went with first child, although nipples were worse at this point with him. Mom has a DEBP at bedside that she is using about every 3 hours. Mom to call for assistance as needed.    Maternal Data Formula Feeding for Exclusion: No Has patient been taught Hand Expression?: Yes Does the patient have  breastfeeding experience prior to this delivery?: Yes  Feeding Feeding Type: Breast Fed Length of feed: 15 min  LATCH Score/Interventions Latch: Grasps breast easily, tongue down, lips flanged, rhythmical sucking. Intervention(s): Breast massage;Breast compression  Audible Swallowing: Spontaneous and intermittent Intervention(s): Skin to skin;Hand expression;Alternate breast massage  Type of Nipple: Everted at rest and after stimulation  Comfort (Breast/Nipple): Filling, red/small blisters or bruises, mild/mod discomfort  Problem noted: Mild/Moderate discomfort Interventions (Mild/moderate discomfort): Comfort gels;Breast shields  Hold (Positioning): Assistance needed to correctly position infant at breast and maintain latch. Intervention(s): Breastfeeding basics reviewed;Support Pillows;Position options;Skin to skin  LATCH Score: 8  Lactation Tools Discussed/Used Tools: Nipple Shields;Pump;Other (comment) (Curved tip syringe) Nipple shield size: 16 Shell Type: Inverted Breast pump type: Double-Electric Breast Pump Pump Review: Setup, frequency, and cleaning   Consult Status Consult Status: Follow-up Date: 04/17/15 Follow-up type: In-patient    Debby Freiberg Khadeem Rockett 04/16/2015, 4:44 PM

## 2015-04-16 NOTE — Anesthesia Postprocedure Evaluation (Signed)
Anesthesia Post Note  Patient: Anita Wiggins  Procedure(s) Performed: * No procedures listed *  Patient location during evaluation: Mother Baby Anesthesia Type: Epidural Level of consciousness: awake and alert Pain management: pain level controlled Vital Signs Assessment: post-procedure vital signs reviewed and stable Respiratory status: spontaneous breathing Cardiovascular status: blood pressure returned to baseline and stable Postop Assessment: no headache, no backache, patient able to bend at knees and no signs of nausea or vomiting Anesthetic complications: no    Last Vitals:  Filed Vitals:   04/15/15 1930 04/15/15 2325  BP: 113/68 111/68  Pulse: 89 82  Temp: 37.1 C 37.2 C  Resp: 18 18    Last Pain:  Filed Vitals:   04/16/15 0148  PainSc: 0-No pain                 Ahmira Boisselle

## 2015-04-16 NOTE — Lactation Note (Signed)
This note was copied from the chart of [facility]. Lactation Consultation Note  Patient Name: Anita Wiggins [id number] Date: 04/16/2015 Reason for consult: Follow-up assessment;Difficult latch;Breast/nipple pain   Follow up with parents. Infant was circumcised this am and has been sleepy since. Mom had pumped earlier and received about 4 cc. She and bedside RN were not able to get infant to take it via finger feeding and syringe. . Infant currently asleep. Left LC # for mom to call for next feeding. Mom reports that it is painful to nurse with and without the NS.    Maternal Data    Feeding Feeding Type: Breast Fed Length of feed: 10 min (on and off for 45; 10 actual minutes of swallows )  LATCH Score/Interventions                      Lactation Tools Discussed/Used Pump Review: Setup, frequency, and cleaning;Milk Storage   Consult Status Consult Status: Follow-up Date: 04/16/15 Follow-up type: In-patient    Debby Freiberg Maxx Pham 04/16/2015, 3:27 PM

## 2015-04-16 NOTE — Lactation Note (Signed)
This note was copied from the chart of [name]. Lactation Consultation Note Mom has a 31 yr old that had to have his upper lip revision clipped. Mom had severe pain trying to latch baby to nipple and to NS. Mom hoping to BF this time.  Mom has semi flat nipples, compress flat. Noted edema to areolas, reverse pressure done, little help noted. Long Tubular breast noted. Hand expression taught w/66ml colostrum. Fitted for #16 NS to Rt. Nipple and #20 NS to Lt. Nipple. Gave shells to evert nipples. Stated she had those w/her son and didn't like them. Explained significance of wearing them. Has DEBP set up by RN. Mom knows to pump q3h for 15-20 min. LC gave her hand pump to define boarders of nipple to apply NS. Assist baby in football position to nipples. Cont. To keep bird mouth when BF. Tried to open flange wider frequently. Noted [name] encouraged to feed baby 8-12 times/24 hours and with feeding cues.[name] getting suck blisters to upper lip. Mom almost in tears saying it was to painful. Had excoriated nipples w/positional stripe to Lt. Nipple. States NS batter than bare nipple BF. Hand expression taught to Mom.  Educated about newborn behavior, STS, I&O, cluster feeding, supply and demand. Referred to Baby and Me Book in Breastfeeding section Pg. 22-23 for position options and Proper latch demonstration.[place] brochure given w/resources, support groups and [place] services.   Patient Name: Anita Wiggins [place] [id number] Date: 04/16/2015 Reason for consult: Initial assessment   Maternal Data Has patient been taught Hand Expression?: Yes Does the patient have breastfeeding experience prior to this delivery?: Yes  Feeding Feeding Type: Breast Milk Length of feed: 10 min  LATCH Score/Interventions Latch: Repeated attempts needed to sustain latch, nipple held in mouth throughout feeding, stimulation needed to elicit sucking reflex. Intervention(s): Adjust position;Assist with latch;Breast massage;Breast  compression  Audible Swallowing: Spontaneous and intermittent Intervention(s): Skin to skin;Hand expression;Alternate breast massage  Type of Nipple: Flat Intervention(s): Reverse pressure;Shells;Hand pump;Double electric pump  Comfort (Breast/Nipple): Filling, red/small blisters or bruises, mild/mod discomfort  Problem noted: Mild/Moderate discomfort Interventions (Mild/moderate discomfort): Comfort gels;Breast shields;Post-pump;Pre-pump if needed;Reverse pressue;Hand expression;Hand massage  Hold (Positioning): Assistance needed to correctly position infant at breast and maintain latch. Intervention(s): Skin to skin;Position options;Support Pillows;Breastfeeding basics reviewed  LATCH Score: 6  Lactation Tools Discussed/Used Tools: Shells;Nipple Shields;Pump;Comfort gels Nipple shield size: 16;20 Shell Type: Inverted Breast pump type: Double-Electric Breast Pump WIC Program: No Pump Review: Setup, frequency, and cleaning;Milk Storage Initiated by:: RN Date initiated:: 04/15/15   Consult Status Consult Status: Follow-up Date: 04/16/15 (in pm) Follow-up type: In-patient    Theodoro Kalata 04/16/2015, 5:40 AM

## 2015-04-16 NOTE — Progress Notes (Signed)
Post Partum Day 1 Subjective: no complaints, up ad lib, voiding and tolerating PO  Objective: Blood pressure 110/70, pulse 72, temperature 98.1 F (36.7 C), temperature source Axillary, resp. rate 16, height 5\' 9"  (1.753 m), weight 158 lb (71.668 kg), last menstrual period 05/19/2013, SpO2 99 %, unknown if currently breastfeeding.  Physical Exam:  General: alert and cooperative Lochia: appropriate Uterine Fundus: firm Incision: healing well DVT Evaluation: No evidence of DVT seen on physical exam. Negative Homan's sign. No cords or calf tenderness. No significant calf/ankle edema.   Recent Labs  04/15/15 0755 04/16/15 0605  HGB 11.6* 9.7*  HCT 35.4* 29.5*    Assessment/Plan: Plan for discharge tomorrow and Circumcision prior to discharge   LOS: 1 day   Tauheedah Bok G 04/16/2015, 8:13 AM

## 2015-04-17 NOTE — Discharge Summary (Signed)
Obstetric Discharge Summary Reason for Admission: induction of labor Prenatal Procedures: ultrasound Intrapartum Procedures: spontaneous vaginal delivery Postpartum Procedures: none Complications-Operative and Postpartum: 1 degree perineal laceration HEMOGLOBIN  Date Value Ref Range Status  04/16/2015 9.7* 12.0 - 15.0 g/dL Final   HCT  Date Value Ref Range Status  04/16/2015 29.5* 36.0 - 46.0 % Final    Physical Exam:  General: alert and cooperative Lochia: appropriate Uterine Fundus: firm Incision: healing well DVT Evaluation: No evidence of DVT seen on physical exam. Negative Homan's sign. No cords or calf tenderness. No significant calf/ankle edema.  Discharge Diagnoses: Term Pregnancy-delivered  Discharge Information: Date: 04/17/2015 Activity: pelvic rest Diet: routine Medications: PNV and Ibuprofen Condition: stable Instructions: refer to practice specific booklet Discharge to: home   Newborn Data: Live born female  Birth Weight: 7 lb 3.5 oz (3275 g) APGAR: 9, 9  Home with mother.  Haziel Molner G 04/17/2015, 8:18 AM

## 2015-04-17 NOTE — Lactation Note (Signed)
This note was copied from the chart of [place]. Lactation Consultation Note  Patient Name: Anita Wiggins [id number] Date: 04/17/2015 Reason for consult: Follow-up assessment;Breast/nipple pain Mom c/o of continued nipple pain with nursing. Using nipple shield to latch, size 20 on left nipple, size 16 on right. Both nipples are red, no cracking noted. Mom latched baby to left breast started with 20 nipple shield, but LC changed to size 16. Mom c/o of pain with the feeding, colostrum present in the nipple shield, nipple round when baby came off the breast. Mom exclusively pump/bottle fed for 8 months her 1st baby due to nipple pain. This baby is noted to have short labial frenulum and short posterior lingual frenulum. Some limited tongue mobility with sucking on LC finger, pressure noted on finger from lower gum line with baby suckling.  At this time Mom plans to keep working with BF but has supplemented and not BF with some feedings. Guidelines for supplementing with BF and for supplementing without BF given to and reviewed with Mom. Advised to increase volumes when baby not BF. Advised Mom to BF with each feeding if she can 8-12 times or more in 24 hours, using nipple shield to latch. Post pump for 15 minutes to encourage milk production and to have EBM to supplement. If not BF Mom stills needs to pump every 3 hours for 15 minutes. OP f/u with lactation scheduled for Wednesday, 04/24/15 at 4:00 pm.  Mom has DEBP for home use. Engorgement care reviewed, refer to page 24 Baby N Me booklet, breast milk storage guidelines reviewed, refer to page 25 Baby N Me booklet.  Encouraged to call for questions/concerns. Advised to apply EBM to sore nipples, Mom has comfort gels.   Maternal Data    Feeding Feeding Type: Breast Fed Length of feed: 5 min  LATCH Score/Interventions Latch: Grasps breast easily, tongue down, lips flanged, rhythmical sucking. (using #16 nipple shield on left breast)  Audible  Swallowing: A few with stimulation  Type of Nipple: Everted at rest and after stimulation (short shaft bilateral)  Comfort (Breast/Nipple): Filling, red/small blisters or bruises, mild/mod discomfort  Problem noted: Mild/Moderate discomfort Interventions (Mild/moderate discomfort): Comfort gels;Hand massage;Hand expression  Hold (Positioning): Assistance needed to correctly position infant at breast and maintain latch. Intervention(s): Breastfeeding basics reviewed;Support Pillows;Position options;Skin to skin  LATCH Score: 7  Lactation Tools Discussed/Used Tools: Comfort gels Nipple shield size: 20;16 Breast pump type: Double-Electric Breast Pump   Consult Status Consult Status: Complete Date: 04/17/15 Follow-up type: In-patient    Katrine Coho 04/17/2015, 10:42 AM

## 2015-04-24 ENCOUNTER — Encounter (HOSPITAL_COMMUNITY): Payer: 59

## 2015-04-24 ENCOUNTER — Ambulatory Visit (HOSPITAL_COMMUNITY)
Admit: 2015-04-24 | Discharge: 2015-04-24 | Disposition: A | Discharge status: Home / Self Care | Payer: 59 | Attending: Obstetrics and Gynecology | Admitting: Obstetrics and Gynecology

## 2015-04-24 NOTE — Lactation Note (Addendum)
Lactation Consult  Mother's reason for visit:  Nipple soreness Visit Type: Outpatient  Consult:  Follow-Up Lactation Consultant:  Vivianne Master [facility]  Baby's Name: Anita Wiggins Date of Birth: 04/15/2016 Pediatrician:[facility] Gender: female Gestational Age: [redacted]w[redacted]d (At Birth) Birth Weight: 7 lb 3.5 oz (3275 g) Weight at Discharge: Weight: 6 lb 12.6 oz (3080 g)Date of Discharge: 04/17/2015 [facility] Weights   04/15/15 1650 04/16/15 0027 04/17/15 0042  Weight: 7 lb 3.5 oz (3275 g) 7 lb 2.8 oz (3255 g) 6 lb 12.6 oz (3080 g)   Last weight taken from location outside of [facility]:7 lb 1.6oz. Location:Pediatrician Weight today: 7 lb 3.6 oz.     ________________________________________________________________________ Mother's nipples tender, sore and has had difficulty latching due to soreness. Currently using All Purpose Nipple Ointment yesterday with improvement but noted rash around areola.  Suggest she call OB/GYN if rash continues or gets worse.   Suggest parents explore possibility of posterior tongue tie and labial lip tightness.  Provided parents with Resources to explore.  Mother has been latching with #16NS with much discomfort. Provided #24 and #20NS.  On left breast baby with #24NS transferred approx 2ml and on right breast with #20NS baby transferred 22 ml for a total of 58 ml.  Rhythmical swallows observed. Suggest mother post pump 2-3x day until she can latch baby without NS.  Recommend she try latching at least once a day without NS until able to latch without and improved comfort.  Give baby back volume pumped at next feeding.    ________________________________________________________________________  Mother's Name: Anita Wiggins Type of delivery:   Breastfeeding Experience:  Multip Maternal Medications: none  ________________________________________________________________________  Breastfeeding History (Post Discharge)  Frequency  of breastfeeding:  9-10xday Duration of feeding:  20-40 min  Patient does not supplement or pump.  Infant Intake and Output Assessment  Voids:  5 in 24 hrs.  Color:  Clear yellow Stools:  5 in 24 hrs.  Color:  Yellow  ________________________________________________________________________  Maternal Breast Assessment  Breast:  Full Nipple:  Erect Pain level:  6 Pain interventions:  Comfort gels, All purpose nipple cream, Expressed breast milk and Inverted shells  _______________________________________________________________________ Feeding Assessment/Evaluation  Initial feeding assessment:  Infant's oral assessment:  Variance  Positioning:  Football Left breast  LATCH documentation:  Latch:  2 = Grasps breast easily, tongue down, lips flanged, rhythmical sucking.  Audible swallowing:  2 = Spontaneous and intermittent  Type of nipple:  2 = Everted at rest and after stimulation  Comfort (Breast/Nipple):  1 = Filling, red/small blisters or bruises, mild/mod discomfort  Hold (Positioning):  2 = No assistance needed to correctly position infant at breast  LATCH score:  9   Attached assessment:  Deep  Lips flanged:  Yes.    Lips untucked:  Yes.  Needed help flanging top lip  Suck assessment:  Displays both  Tools:  Nipple shield 20 mm and Shells Instructed on use and cleaning of tool:  Yes.    Pre-feed weight:  3220 g  Post-feed weight:  3256 g  Amount transferred:  36 ml  Additional Feeding Assessment -   Infant's oral assessment:  Variance  Cupped tongue, tight upper lip, lingual posterior tightness  Positioning:  Cross cradle Right breast  LATCH documentation:  Latch:  2 = Grasps breast easily, tongue down, lips flanged, rhythmical sucking.  Audible swallowing:  1 = A few with stimulation  Type of nipple:  2 = Everted at rest and after stimulation  Comfort (  Breast/Nipple):  1 = Filling, red/small blisters or bruises, mild/mod discomfort  Hold  (Positioning):  2 = No assistance needed to correctly position infant at breast  LATCH score:  8   Attached assessment:  Deep  Lips flanged:  Yes.    Lips untucked:  Yes.    Suck assessment:  Displays both  Tools:  Nipple shield 24 mm and Shells Instructed on use and cleaning of tool:  Yes.    Pre-feed weight:  3256 g  Post-feed weight:  3278 g  Amount transferred:  22 ml   Total amount transferred:  58 ml

## 2015-05-22 DIAGNOSIS — Z1389 Encounter for screening for other disorder: Secondary | ICD-10-CM | POA: Diagnosis not present

## 2015-07-01 ENCOUNTER — Telehealth (HOSPITAL_COMMUNITY): Payer: Self-pay

## 2015-07-01 NOTE — Telephone Encounter (Signed)
No answer. Left message.

## 2015-07-02 ENCOUNTER — Telehealth (HOSPITAL_COMMUNITY): Payer: Self-pay | Admitting: Lactation Services

## 2015-07-02 DIAGNOSIS — L723 Sebaceous cyst: Secondary | ICD-10-CM | POA: Diagnosis not present

## 2015-07-02 NOTE — Telephone Encounter (Signed)
Returned phone call to Mom from yesterday. Tried to call yesterday but missed her twice. Mom was c/o of clogged milk duct on right breast. Today she reports resolving.  She went to [facility] today and was advised she also has sebaceous cyst, has been put on antibiotics, will be taking Ibuprofen. Mom reports she is pump/bottle feeding, latch never improved. Advised to pump every 2-3 hours, apply warm compress prior to pumping, good massage while pumping. Mom to advise if clogged duct does not resolve completely or has other questions/concerns.

## 2015-07-15 MED FILL — NORETHINDRONE 0.35 MG TAB: 0.35 | 84 days supply | Qty: 84 | Fill #0

## 2015-09-05 NOTE — Telephone Encounter (Signed)
Opened in error

## 2015-09-25 DIAGNOSIS — D225 Melanocytic nevi of trunk: Secondary | ICD-10-CM | POA: Diagnosis not present

## 2015-09-25 DIAGNOSIS — Z1283 Encounter for screening for malignant neoplasm of skin: Secondary | ICD-10-CM | POA: Diagnosis not present

## 2015-10-09 MED FILL — NORETHINDRONE 0.35 MG TAB: 0.35 | 84 days supply | Qty: 84 | Fill #1

## 2015-12-24 MED FILL — HEATHER TABLET: 0.35 | 84 days supply | Qty: 84 | Fill #2

## 2015-12-25 ENCOUNTER — Ambulatory Visit (HOSPITAL_COMMUNITY)
Admission: RE | Admit: 2015-12-25 | Discharge: 2015-12-25 | Disposition: A | Discharge status: Home / Self Care | Payer: 59 | Source: Ambulatory Visit | Attending: Pulmonary Disease | Admitting: Pulmonary Disease

## 2015-12-25 ENCOUNTER — Other Ambulatory Visit: Payer: Self-pay | Admitting: Pulmonary Disease

## 2015-12-25 DIAGNOSIS — R918 Other nonspecific abnormal finding of lung field: Secondary | ICD-10-CM | POA: Insufficient documentation

## 2015-12-25 DIAGNOSIS — R05 Cough: Secondary | ICD-10-CM

## 2015-12-25 DIAGNOSIS — R059 Cough, unspecified: Secondary | ICD-10-CM

## 2015-12-27 DIAGNOSIS — J189 Pneumonia, unspecified organism: Secondary | ICD-10-CM | POA: Diagnosis not present

## 2015-12-27 DIAGNOSIS — R634 Abnormal weight loss: Secondary | ICD-10-CM | POA: Diagnosis not present

## 2015-12-30 ENCOUNTER — Other Ambulatory Visit: Payer: Self-pay | Admitting: Pulmonary Disease

## 2015-12-30 MED ORDER — AMOXICILLIN 250 MG PO CAPS: 500.0000 mg | ORAL_CAPSULE | Freq: Three times a day (TID) | ORAL | Status: DC

## 2016-02-12 ENCOUNTER — Emergency Department (HOSPITAL_COMMUNITY)
Admission: EM | Admit: 2016-02-12 | Discharge: 2016-02-13 | Disposition: A | Discharge status: Home / Self Care | Payer: 59 | Attending: Emergency Medicine | Admitting: Emergency Medicine

## 2016-02-12 ENCOUNTER — Encounter (HOSPITAL_COMMUNITY): Payer: Self-pay | Admitting: Emergency Medicine

## 2016-02-12 ENCOUNTER — Emergency Department (HOSPITAL_COMMUNITY): Payer: 59

## 2016-02-12 DIAGNOSIS — J040 Acute laryngitis: Secondary | ICD-10-CM | POA: Insufficient documentation

## 2016-02-12 DIAGNOSIS — R05 Cough: Secondary | ICD-10-CM | POA: Diagnosis not present

## 2016-02-12 DIAGNOSIS — Z79899 Other long term (current) drug therapy: Secondary | ICD-10-CM | POA: Diagnosis not present

## 2016-02-12 DIAGNOSIS — R0602 Shortness of breath: Secondary | ICD-10-CM | POA: Diagnosis not present

## 2016-02-12 MED ORDER — RACEPINEPHRINE HCL 2.25 % IN NEBU
0.5000 mL | INHALATION_SOLUTION | Freq: Once | RESPIRATORY_TRACT | Status: AC
  Administered 2016-02-13: 0.5 mL via RESPIRATORY_TRACT
  Filled 2016-02-12: qty 0.5

## 2016-02-12 MED ORDER — IPRATROPIUM-ALBUTEROL 0.5-2.5 (3) MG/3ML IN SOLN
3.0000 mL | Freq: Once | RESPIRATORY_TRACT | Status: AC
  Administered 2016-02-12: 3 mL via RESPIRATORY_TRACT
  Filled 2016-02-12: qty 3

## 2016-02-12 MED ORDER — DEXAMETHASONE 4 MG PO TABS
12.0000 mg | ORAL_TABLET | Freq: Once | ORAL | Status: AC
  Administered 2016-02-12: 12 mg via ORAL
  Filled 2016-02-12: qty 3

## 2016-02-12 NOTE — ED Triage Notes (Signed)
Pt reports SOB that started suddenly 2 hours ago. Pt c/o productive cough with thick greenish sputum. Per family member pt started coughing approx 2 hours ago as well.

## 2016-02-12 NOTE — ED Notes (Signed)
ED Provider at bedside. 

## 2016-02-12 NOTE — ED Provider Notes (Signed)
[facility] DEPT Provider Note   CSN: [id number] Arrival date & time: 02/12/16  2224     History   Chief Complaint Chief Complaint  Patient presents with  . Shortness of Breath    HPI Anita Wiggins is a 30 y.o. female.  She had onset at about 8:00 PM of harsh cough and a sense that her throat was closing. She denies fever, but has had chills and sweats. She has had dyspnea, but no chest pain. She has had myalgias. There have been multiple sick contacts. She is breast feeding.   The history is provided by the patient.  Shortness of Breath     Past Medical History:  Diagnosis Date  . Hx of varicella   . No pertinent past medical history   . Shingles     Patient Active Problem List   Diagnosis Date Noted  . Pregnancy 04/15/2015  . Ectopic pregnancy 07/15/2013    Past Surgical History:  Procedure Laterality Date  . DIAGNOSTIC LAPAROSCOPY WITH REMOVAL OF ECTOPIC PREGNANCY Right 07/21/2013   Procedure: DIAGNOSTIC LAPAROSCOPY WITH REMOVAL OF ECTOPIC PREGNANCY;  Surgeon: Linda Hedges, DO;  Location: [facility];  Service: Gynecology;  Laterality: Right;  . NO PAST SURGERIES    . UMBILICAL HERNIA REPAIR      OB History    Gravida Para Term Preterm AB Living   4 2 2   2 2    SAB TAB Ectopic Multiple Live Births   1   1 0 2       Home Medications    Prior to Admission medications   Medication Sig Start Date End Date Taking? Authorizing Provider  ibuprofen (ADVIL,MOTRIN) 200 MG tablet Take 400 mg by mouth every 6 (six) hours as needed.   Yes Historical Provider, MD  PRESCRIPTION MEDICATION 1 tablet daily. Mini-pill   Yes Historical Provider, MD    Family History Family History  Problem Relation Age of Onset  . Heart disease Maternal Grandfather   . Congestive Heart Failure Maternal Grandfather   . Cancer Maternal Grandfather     lymphoma  . Diabetes Paternal Uncle     Social History Social History  Substance Use Topics  . Smoking status: Never Smoker  .  Smokeless tobacco: Never Used  . Alcohol use No     Allergies   Patient has no known allergies.   Review of Systems Review of Systems  Respiratory: Positive for shortness of breath.   All other systems reviewed and are negative.    Physical Exam Updated Vital Signs BP 144/89   Pulse 107   Temp 97.6 F (36.4 C)   Resp 18   Ht 5\' 9"  (1.753 m)   Wt 130 lb (59 kg)   SpO2 100%   BMI 19.20 kg/m   Physical Exam  Nursing note and vitals reviewed.  30 year old female, resting comfortably and in no acute distress. Vital signs are significant for tachycardia and hypertension. Oxygen saturation is 100%, which is normal. Head is normocephalic and atraumatic. PERRLA, EOMI. Oropharynx is clear. Voice is hoarse, but there is no pooling of secretions and no stridor. Neck is nontender and supple without adenopathy or JVD. Back is nontender and there is no CVA tenderness. Lungs are clear without rales, wheezes, or rhonchi. Croupy cough is present. Chest is nontender. Heart has regular rate and rhythm without murmur. Abdomen is soft, flat, nontender without masses or hepatosplenomegaly and peristalsis is normoactive. Extremities have no cyanosis or edema, full range of  motion is present. Skin is warm and dry without rash. Neurologic: Mental status is normal, cranial nerves are intact, there are no motor or sensory deficits.  ED Treatments / Results   EKG  EKG Interpretation  Date/Time:  Wednesday February 12 2016 22:50:20 EST Ventricular Rate:  92 PR Interval:    QRS Duration: 88 QT Interval:  325 QTC Calculation: 402 R Axis:   78 Text Interpretation:  Sinus rhythm Normal ECG No old tracing to compare Confirmed by [facility]  MD, Erling Arrazola ([id number]) on 02/12/2016 10:56:23 PM       Radiology Dg Chest 2 View  Result Date: 02/12/2016 CLINICAL DATA:  Sudden onset shortness of breath 2 hours ago. Productive cough. EXAM: CHEST  2 VIEW COMPARISON:  12/25/2015 FINDINGS: Emphysematous  changes in the lungs. Normal heart size and pulmonary vascularity. No focal airspace disease or consolidation in the lungs. No blunting of costophrenic angles. No pneumothorax. Mediastinal contours appear intact. IMPRESSION: Emphysematous changes in the lungs. No evidence of active pulmonary disease. Electronically Signed   By: Lucienne Capers M.D.   On: 02/12/2016 23:00    Procedures Procedures (including critical care time)  Medications Ordered in ED Medications  ipratropium-albuterol (DUONEB) 0.5-2.5 (3) MG/3ML nebulizer solution 3 mL (not administered)  dexamethasone (DECADRON) tablet 12 mg (not administered)     Initial Impression / Assessment and Plan / ED Course  I have reviewed the triage vital signs and the nursing notes.  Pertinent imaging results that were available during my care of the patient were reviewed by me and considered in my medical decision making (see chart for details).  Clinical Course    Respiratory tract infection. No evidence of pneumonia - probable viral illness.Will give therapeutic trial of abuterol/ipratropium. She is given a dose of oral dexamethasone. Old records are reviewed, and she has no relevant past visits.  She had no real response to albuterol plus ipratropium. She continued to have croupy cough. Therefore, she was given a trial of racemic epinephrine via nebulizer. Following this, her voice improved and she felt significantly better. I have discussed with her the fact that children are prone to rebound symptoms. Because of the larger airway in an adult, I do not feel this is a significant concern but advised her to return should symptoms worsen. She is advised to manage her symptoms the same way she would manage croup in her child (she has had a child with croup).  Final Clinical Impressions(s) / ED Diagnoses   Final diagnoses:  Laryngitis    New Prescriptions New Prescriptions   No medications on file     Delora Fuel, MD [id number]  [phone]

## 2016-02-13 DIAGNOSIS — R0602 Shortness of breath: Secondary | ICD-10-CM | POA: Diagnosis not present

## 2016-02-13 DIAGNOSIS — J040 Acute laryngitis: Secondary | ICD-10-CM | POA: Diagnosis not present

## 2016-02-13 DIAGNOSIS — Z79899 Other long term (current) drug therapy: Secondary | ICD-10-CM | POA: Diagnosis not present

## 2016-02-13 MED ORDER — SODIUM CHLORIDE 0.9 % IN NEBU
INHALATION_SOLUTION | RESPIRATORY_TRACT | Status: AC
  Administered 2016-02-13: 3 mL
  Filled 2016-02-13: qty 3

## 2016-02-13 NOTE — Discharge Instructions (Signed)
If symptoms start getting worse, try going in a hot shower. If that does not help, try going outside in the cool air. Return if you are having any problems.

## 2016-03-11 MED FILL — HEATHER TABLET: 0.35 | 56 days supply | Qty: 56 | Fill #3

## 2016-12-21 DIAGNOSIS — N912 Amenorrhea, unspecified: Secondary | ICD-10-CM | POA: Diagnosis not present

## 2016-12-23 DIAGNOSIS — N912 Amenorrhea, unspecified: Secondary | ICD-10-CM | POA: Diagnosis not present

## 2017-01-07 DIAGNOSIS — N911 Secondary amenorrhea: Secondary | ICD-10-CM | POA: Diagnosis not present

## 2017-01-15 DIAGNOSIS — Z3A01 Less than 8 weeks gestation of pregnancy: Secondary | ICD-10-CM | POA: Diagnosis not present

## 2017-01-15 DIAGNOSIS — Z3481 Encounter for supervision of other normal pregnancy, first trimester: Secondary | ICD-10-CM | POA: Diagnosis not present

## 2017-01-15 LAB — OB RESULTS CONSOLE ABO/RH: RH Type: POSITIVE

## 2017-01-15 LAB — OB RESULTS CONSOLE ANTIBODY SCREEN: Antibody Screen: NEGATIVE

## 2017-01-15 LAB — OB RESULTS CONSOLE GC/CHLAMYDIA
Chlamydia: NEGATIVE
Gonorrhea: NEGATIVE

## 2017-01-15 LAB — OB RESULTS CONSOLE RUBELLA ANTIBODY, IGM: Rubella: IMMUNE

## 2017-01-15 LAB — OB RESULTS CONSOLE HIV ANTIBODY (ROUTINE TESTING): HIV: NONREACTIVE

## 2017-01-15 LAB — OB RESULTS CONSOLE RPR: RPR: NONREACTIVE

## 2017-01-15 LAB — OB RESULTS CONSOLE HEPATITIS B SURFACE ANTIGEN: Hepatitis B Surface Ag: NEGATIVE

## 2017-02-02 DIAGNOSIS — Z3491 Encounter for supervision of normal pregnancy, unspecified, first trimester: Secondary | ICD-10-CM | POA: Diagnosis not present

## 2017-02-02 DIAGNOSIS — Z113 Encounter for screening for infections with a predominantly sexual mode of transmission: Secondary | ICD-10-CM | POA: Diagnosis not present

## 2017-02-02 DIAGNOSIS — Z3480 Encounter for supervision of other normal pregnancy, unspecified trimester: Secondary | ICD-10-CM | POA: Diagnosis not present

## 2017-02-18 DIAGNOSIS — Z36 Encounter for antenatal screening for chromosomal anomalies: Secondary | ICD-10-CM | POA: Diagnosis not present

## 2017-02-18 DIAGNOSIS — Z3682 Encounter for antenatal screening for nuchal translucency: Secondary | ICD-10-CM | POA: Diagnosis not present

## 2017-02-18 DIAGNOSIS — Z3491 Encounter for supervision of normal pregnancy, unspecified, first trimester: Secondary | ICD-10-CM | POA: Diagnosis not present

## 2017-02-18 DIAGNOSIS — Z3A12 12 weeks gestation of pregnancy: Secondary | ICD-10-CM | POA: Diagnosis not present

## 2017-04-01 DIAGNOSIS — Z363 Encounter for antenatal screening for malformations: Secondary | ICD-10-CM | POA: Diagnosis not present

## 2017-04-05 IMAGING — DX DG CHEST 2V
2 series · 2 of 2 positions shown · non-contrast
Comparison: 12/25/2015

CLINICAL DATA: Sudden onset shortness of breath 2 hours ago.
Productive cough.

EXAM:
CHEST  2 VIEW

[chest pa]
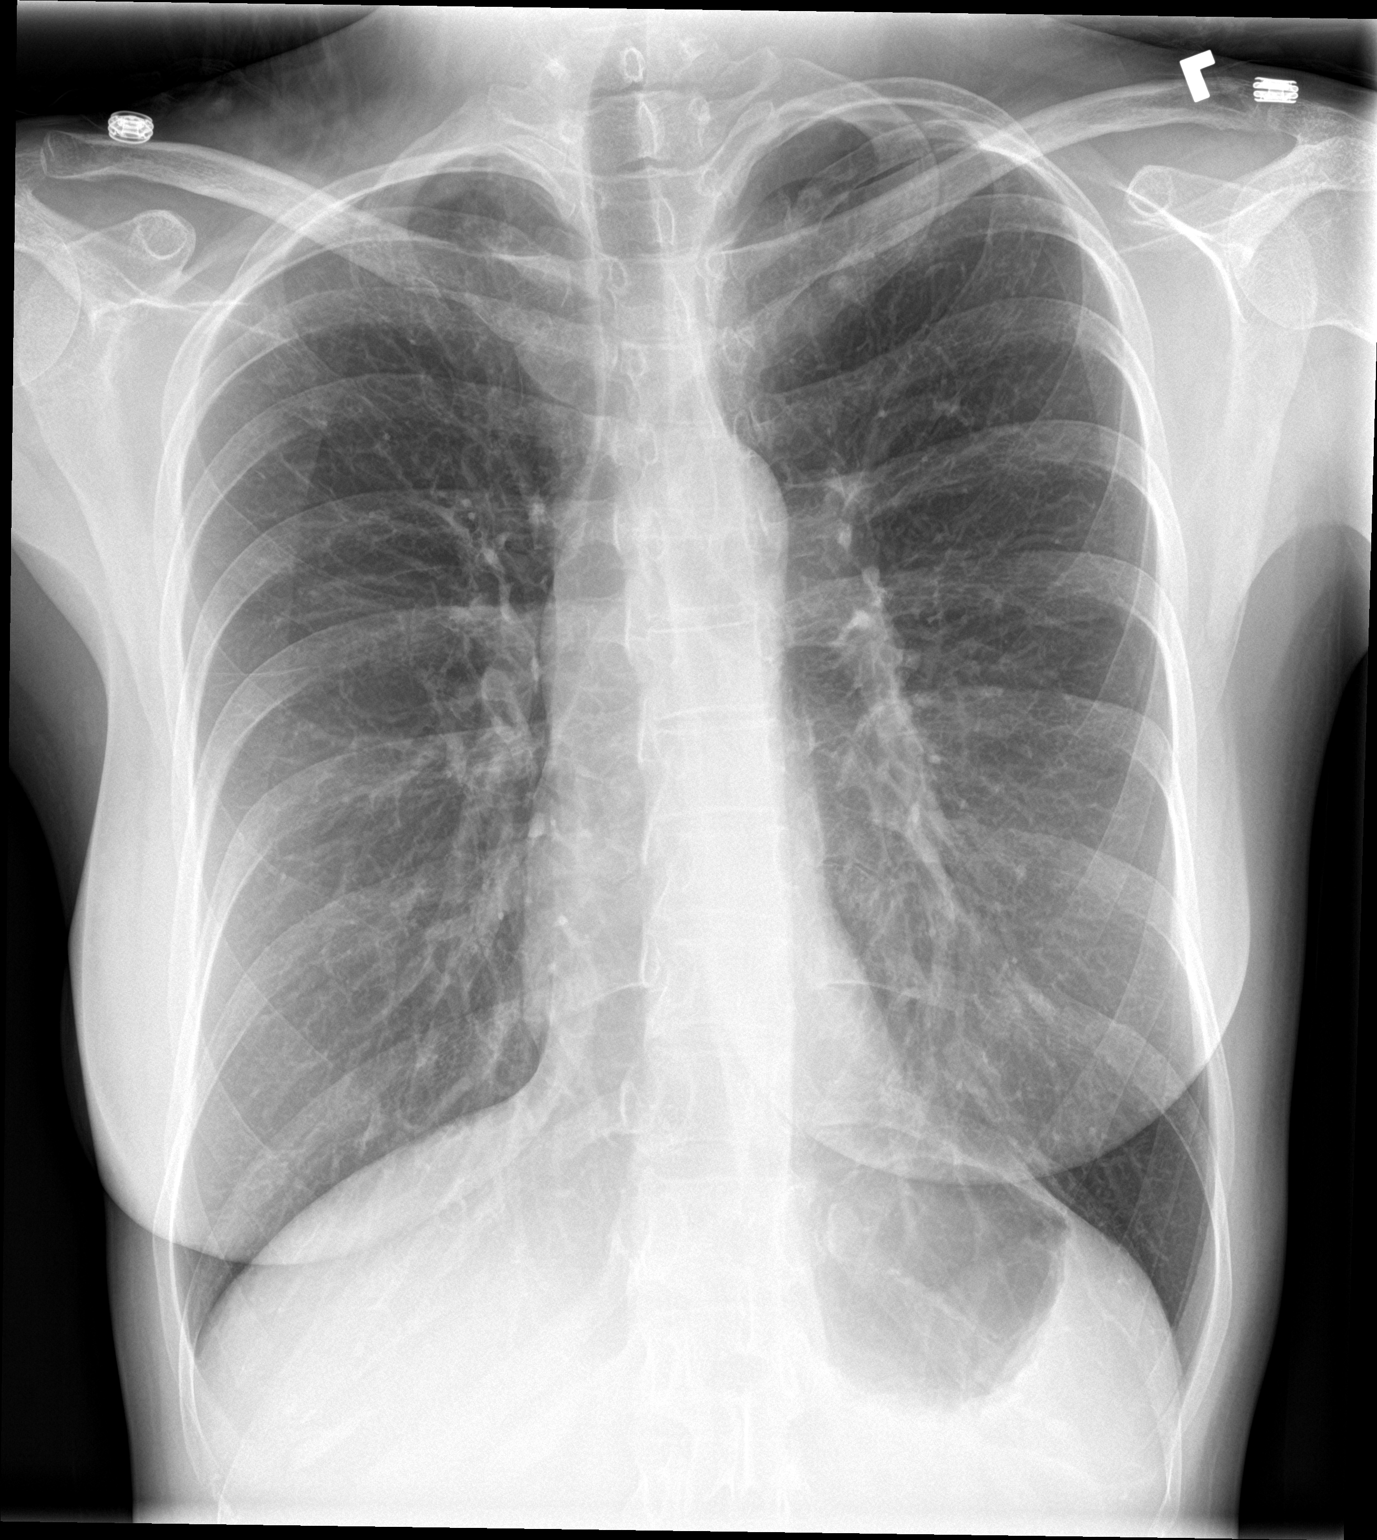

[chest lat]
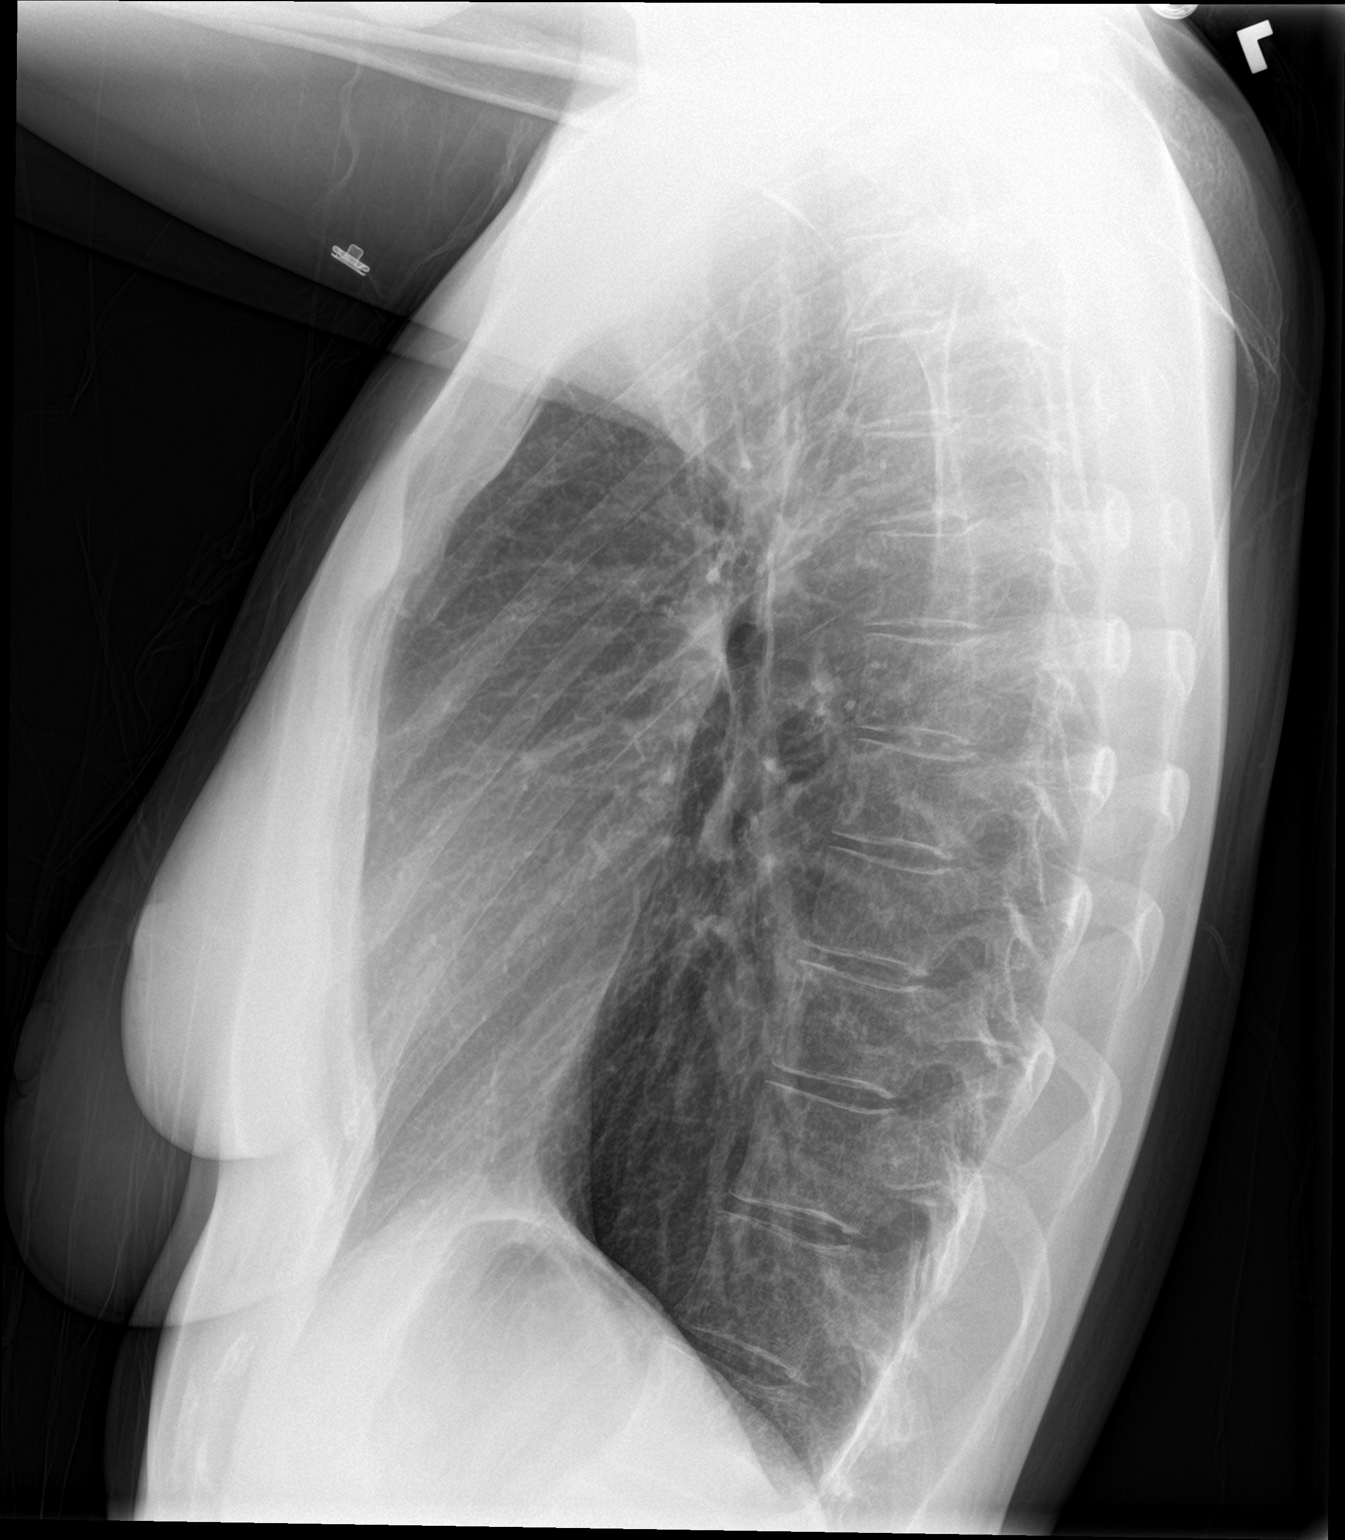

[2 of 2 positions shown; findings below may reference images not displayed]

FINDINGS: Emphysematous changes in the lungs. Normal heart size and pulmonary
vascularity. No focal airspace disease or consolidation in the
lungs. No blunting of costophrenic angles. No pneumothorax.
Mediastinal contours appear intact.
IMPRESSION: Emphysematous changes in the lungs. No evidence of active pulmonary
disease.

## 2017-06-07 DIAGNOSIS — Z3480 Encounter for supervision of other normal pregnancy, unspecified trimester: Secondary | ICD-10-CM | POA: Diagnosis not present

## 2017-06-07 DIAGNOSIS — Z23 Encounter for immunization: Secondary | ICD-10-CM | POA: Diagnosis not present

## 2017-06-17 DIAGNOSIS — O99810 Abnormal glucose complicating pregnancy: Secondary | ICD-10-CM | POA: Diagnosis not present

## 2017-06-17 DIAGNOSIS — Z3A29 29 weeks gestation of pregnancy: Secondary | ICD-10-CM | POA: Diagnosis not present

## 2017-07-07 DIAGNOSIS — D649 Anemia, unspecified: Secondary | ICD-10-CM | POA: Diagnosis not present

## 2017-08-06 DIAGNOSIS — Z3480 Encounter for supervision of other normal pregnancy, unspecified trimester: Secondary | ICD-10-CM | POA: Diagnosis not present

## 2017-08-06 DIAGNOSIS — Z3685 Encounter for antenatal screening for Streptococcus B: Secondary | ICD-10-CM | POA: Diagnosis not present

## 2017-08-06 DIAGNOSIS — Z3A36 36 weeks gestation of pregnancy: Secondary | ICD-10-CM | POA: Diagnosis not present

## 2017-08-06 LAB — OB RESULTS CONSOLE GBS: GBS: NEGATIVE

## 2017-08-11 DIAGNOSIS — D649 Anemia, unspecified: Secondary | ICD-10-CM | POA: Diagnosis not present

## 2017-08-23 ENCOUNTER — Telehealth (HOSPITAL_COMMUNITY): Payer: Self-pay | Admitting: *Deleted

## 2017-08-24 ENCOUNTER — Telehealth (HOSPITAL_COMMUNITY): Payer: Self-pay | Admitting: *Deleted

## 2017-08-24 ENCOUNTER — Encounter (HOSPITAL_COMMUNITY): Payer: Self-pay | Admitting: *Deleted

## 2017-08-24 NOTE — Telephone Encounter (Signed)
Preadmission screen  

## 2017-08-29 ENCOUNTER — Inpatient Hospital Stay (HOSPITAL_COMMUNITY): Admit: 2017-08-29 | Payer: Self-pay

## 2017-08-30 NOTE — H&P (Signed)
Anita Wiggins is a 32 y.o. female presenting for postdates IOL.  Pregnancy uncomplicated.     OB History    Gravida  5   Para  2   Term  2   Preterm      AB  2   Living  2     SAB  1   TAB      Ectopic  1   Multiple  0   Live Births  2          Past Medical History:  Diagnosis Date  . Anemia   . Hx of varicella   . No pertinent past medical history   . Shingles    Past Surgical History:  Procedure Laterality Date  . DIAGNOSTIC LAPAROSCOPY WITH REMOVAL OF ECTOPIC PREGNANCY Right 07/21/2013   Procedure: DIAGNOSTIC LAPAROSCOPY WITH REMOVAL OF ECTOPIC PREGNANCY;  Surgeon: Linda Hedges, DO;  Location: [facility];  Service: Gynecology;  Laterality: Right;  . NO PAST SURGERIES    . UMBILICAL HERNIA REPAIR     Family History: family history includes Cancer in her maternal grandfather; Congestive Heart Failure in her maternal grandfather; Diabetes in her paternal uncle; Heart disease in her maternal grandfather. Social History:  reports that she has never smoked. She has never used smokeless tobacco. She reports that she does not drink alcohol or use drugs.     Maternal Diabetes: No Genetic Screening: Normal Maternal Ultrasounds/Referrals: Normal Fetal Ultrasounds or other Referrals:  None Maternal Substance Abuse:  No Significant Maternal Medications:  None Significant Maternal Lab Results:  None Other Comments:  None  ROS History   Exam Physical Exam  Gen - NAD Abd - gravid, NT Ext - NT Cvx 2cm on last office exam Prenatal labs: ABO, Rh: O/Positive/-- (10/26 0000) Antibody: Negative (10/26 0000) Rubella: Immune (10/26 0000) RPR: Nonreactive (10/26 0000)  HBsAg: Negative (10/26 0000)  HIV: Non-reactive (10/26 0000)  GBS:   Negative  Assessment/Plan: Admit Pitocin IOL Epidural prn  Anita Wiggins 08/30/2017, 4:56 PM

## 2017-08-31 ENCOUNTER — Inpatient Hospital Stay (HOSPITAL_COMMUNITY): Payer: BLUE CROSS/BLUE SHIELD | Admitting: Anesthesiology

## 2017-08-31 ENCOUNTER — Inpatient Hospital Stay (HOSPITAL_COMMUNITY): Admission: RE | Admit: 2017-08-31 | Payer: Self-pay | Source: Ambulatory Visit | Admitting: Obstetrics and Gynecology

## 2017-08-31 ENCOUNTER — Inpatient Hospital Stay (HOSPITAL_COMMUNITY)
Admission: RE | Admit: 2017-08-31 | Discharge: 2017-09-02 | DRG: 807 | Disposition: A | Discharge status: Home / Self Care | Payer: BLUE CROSS/BLUE SHIELD | Attending: Obstetrics and Gynecology | Admitting: Obstetrics and Gynecology

## 2017-08-31 ENCOUNTER — Encounter (HOSPITAL_COMMUNITY): Payer: Self-pay

## 2017-08-31 ENCOUNTER — Other Ambulatory Visit: Payer: Self-pay

## 2017-08-31 DIAGNOSIS — Z3A40 40 weeks gestation of pregnancy: Secondary | ICD-10-CM

## 2017-08-31 DIAGNOSIS — O480 Post-term pregnancy: Principal | ICD-10-CM | POA: Diagnosis present

## 2017-08-31 DIAGNOSIS — Z3490 Encounter for supervision of normal pregnancy, unspecified, unspecified trimester: Secondary | ICD-10-CM

## 2017-08-31 LAB — TYPE AND SCREEN
ABO/RH(D): O POS
Antibody Screen: NEGATIVE

## 2017-08-31 LAB — CBC
HCT: 37.3 % (ref 36.0–46.0)
Hemoglobin: 12.3 g/dL (ref 12.0–15.0)
MCH: 28.9 pg (ref 26.0–34.0)
MCHC: 33 g/dL (ref 30.0–36.0)
MCV: 87.8 fL (ref 78.0–100.0)
Platelets: 223 10*3/uL (ref 150–400)
RBC: 4.25 MIL/uL (ref 3.87–5.11)
RDW: 17.8 % — ABNORMAL HIGH (ref 11.5–15.5)
WBC: 10.6 10*3/uL — ABNORMAL HIGH (ref 4.0–10.5)

## 2017-08-31 MED ORDER — COCONUT OIL OIL: 1.0000 "application " | TOPICAL_OIL | Status: DC | PRN

## 2017-08-31 MED ORDER — OXYCODONE-ACETAMINOPHEN 5-325 MG PO TABS: 1.0000 | ORAL_TABLET | ORAL | Status: DC | PRN

## 2017-08-31 MED ORDER — ACETAMINOPHEN 325 MG PO TABS: 650.0000 mg | ORAL_TABLET | ORAL | Status: DC | PRN

## 2017-08-31 MED ORDER — WITCH HAZEL-GLYCERIN EX PADS: 1.0000 "application " | MEDICATED_PAD | CUTANEOUS | Status: DC | PRN

## 2017-08-31 MED ORDER — FENTANYL 2.5 MCG/ML BUPIVACAINE 1/10 % EPIDURAL INFUSION (WH - ANES)
14.0000 mL/h | INTRAMUSCULAR | Status: DC | PRN
  Administered 2017-08-31: 14 mL/h via EPIDURAL
  Filled 2017-08-31: qty 100

## 2017-08-31 MED ORDER — PHENYLEPHRINE 40 MCG/ML (10ML) SYRINGE FOR IV PUSH (FOR BLOOD PRESSURE SUPPORT)
80.0000 ug | PREFILLED_SYRINGE | INTRAVENOUS | Status: DC | PRN
  Filled 2017-08-31: qty 5

## 2017-08-31 MED ORDER — BUTORPHANOL TARTRATE 1 MG/ML IJ SOLN: 1.0000 mg | INTRAMUSCULAR | Status: DC | PRN

## 2017-08-31 MED ORDER — EPHEDRINE 5 MG/ML INJ
10.0000 mg | INTRAVENOUS | Status: DC | PRN
  Filled 2017-08-31: qty 2

## 2017-08-31 MED ORDER — LIDOCAINE HCL (PF) 1 % IJ SOLN
30.0000 mL | INTRAMUSCULAR | Status: DC | PRN
  Filled 2017-08-31: qty 30

## 2017-08-31 MED ORDER — PRENATAL MULTIVITAMIN CH
1.0000 | ORAL_TABLET | Freq: Every day | ORAL | Status: DC
  Administered 2017-09-01 – 2017-09-02 (×2): 1 via ORAL
  Filled 2017-08-31 (×2): qty 1

## 2017-08-31 MED ORDER — DIPHENHYDRAMINE HCL 50 MG/ML IJ SOLN: 12.5000 mg | INTRAMUSCULAR | Status: DC | PRN

## 2017-08-31 MED ORDER — OXYCODONE-ACETAMINOPHEN 5-325 MG PO TABS: 2.0000 | ORAL_TABLET | ORAL | Status: DC | PRN

## 2017-08-31 MED ORDER — ONDANSETRON HCL 4 MG PO TABS: 4.0000 mg | ORAL_TABLET | ORAL | Status: DC | PRN

## 2017-08-31 MED ORDER — ONDANSETRON HCL 4 MG/2ML IJ SOLN: 4.0000 mg | Freq: Four times a day (QID) | INTRAMUSCULAR | Status: DC | PRN

## 2017-08-31 MED ORDER — LACTATED RINGERS IV SOLN
500.0000 mL | Freq: Once | INTRAVENOUS | Status: AC
  Administered 2017-08-31: 500 mL via INTRAVENOUS

## 2017-08-31 MED ORDER — TERBUTALINE SULFATE 1 MG/ML IJ SOLN
0.2500 mg | Freq: Once | INTRAMUSCULAR | Status: DC | PRN
  Filled 2017-08-31: qty 1

## 2017-08-31 MED ORDER — FENTANYL 2.5 MCG/ML BUPIVACAINE 1/10 % EPIDURAL INFUSION (WH - ANES): 14.0000 mL/h | INTRAMUSCULAR | Status: DC | PRN

## 2017-08-31 MED ORDER — LACTATED RINGERS IV SOLN
INTRAVENOUS | Status: DC
  Administered 2017-08-31: 09:00:00 via INTRAVENOUS

## 2017-08-31 MED ORDER — SIMETHICONE 80 MG PO CHEW: 80.0000 mg | CHEWABLE_TABLET | ORAL | Status: DC | PRN

## 2017-08-31 MED ORDER — ONDANSETRON HCL 4 MG/2ML IJ SOLN: 4.0000 mg | INTRAMUSCULAR | Status: DC | PRN

## 2017-08-31 MED ORDER — IBUPROFEN 600 MG PO TABS
600.0000 mg | ORAL_TABLET | Freq: Four times a day (QID) | ORAL | Status: DC
  Administered 2017-08-31 – 2017-09-02 (×8): 600 mg via ORAL
  Filled 2017-08-31 (×8): qty 1

## 2017-08-31 MED ORDER — TETANUS-DIPHTH-ACELL PERTUSSIS 5-2.5-18.5 LF-MCG/0.5 IM SUSP: 0.5000 mL | Freq: Once | INTRAMUSCULAR | Status: DC

## 2017-08-31 MED ORDER — DIBUCAINE 1 % RE OINT: 1.0000 "application " | TOPICAL_OINTMENT | RECTAL | Status: DC | PRN

## 2017-08-31 MED ORDER — OXYTOCIN BOLUS FROM INFUSION
500.0000 mL | Freq: Once | INTRAVENOUS | Status: AC
  Administered 2017-08-31: 500 mL via INTRAVENOUS

## 2017-08-31 MED ORDER — OXYTOCIN 40 UNITS IN LACTATED RINGERS INFUSION - SIMPLE MED
1.0000 m[IU]/min | INTRAVENOUS | Status: DC
  Administered 2017-08-31: 2 m[IU]/min via INTRAVENOUS
  Filled 2017-08-31: qty 1000

## 2017-08-31 MED ORDER — MEASLES, MUMPS & RUBELLA VAC ~~LOC~~ INJ
0.5000 mL | INJECTION | Freq: Once | SUBCUTANEOUS | Status: DC
  Filled 2017-08-31: qty 0.5

## 2017-08-31 MED ORDER — DIPHENHYDRAMINE HCL 25 MG PO CAPS: 25.0000 mg | ORAL_CAPSULE | Freq: Four times a day (QID) | ORAL | Status: DC | PRN

## 2017-08-31 MED ORDER — LACTATED RINGERS IV SOLN: 500.0000 mL | INTRAVENOUS | Status: DC | PRN

## 2017-08-31 MED ORDER — SENNOSIDES-DOCUSATE SODIUM 8.6-50 MG PO TABS
2.0000 | ORAL_TABLET | ORAL | Status: DC
  Administered 2017-08-31 – 2017-09-01 (×2): 2 via ORAL
  Filled 2017-08-31 (×2): qty 2

## 2017-08-31 MED ORDER — SOD CITRATE-CITRIC ACID 500-334 MG/5ML PO SOLN: 30.0000 mL | ORAL | Status: DC | PRN

## 2017-08-31 MED ORDER — LIDOCAINE HCL (PF) 1 % IJ SOLN
INTRAMUSCULAR | Status: DC | PRN
  Administered 2017-08-31 (×2): 4 mL via EPIDURAL

## 2017-08-31 MED ORDER — PHENYLEPHRINE 40 MCG/ML (10ML) SYRINGE FOR IV PUSH (FOR BLOOD PRESSURE SUPPORT)
80.0000 ug | PREFILLED_SYRINGE | INTRAVENOUS | Status: DC | PRN
  Filled 2017-08-31: qty 5
  Filled 2017-08-31: qty 10

## 2017-08-31 MED ORDER — BENZOCAINE-MENTHOL 20-0.5 % EX AERO
1.0000 "application " | INHALATION_SPRAY | CUTANEOUS | Status: DC | PRN
  Administered 2017-09-01: 1 via TOPICAL
  Filled 2017-08-31: qty 56

## 2017-08-31 MED ORDER — OXYTOCIN 40 UNITS IN LACTATED RINGERS INFUSION - SIMPLE MED: 2.5000 [IU]/h | INTRAVENOUS | Status: DC

## 2017-08-31 MED ORDER — MEDROXYPROGESTERONE ACETATE 150 MG/ML IM SUSP: 150.0000 mg | INTRAMUSCULAR | Status: DC | PRN

## 2017-08-31 NOTE — Anesthesia Preprocedure Evaluation (Signed)
Anesthesia Evaluation  Patient identified by MRN, date of birth, ID band Patient awake    Reviewed: Allergy & Precautions, H&P , Patient's Chart, lab work & pertinent test results  Airway Mallampati: II  TM Distance: >3 FB Neck ROM: full    Dental  (+) Teeth Intact   Pulmonary    breath sounds clear to auscultation       Cardiovascular  Rhythm:regular Rate:Normal     Neuro/Psych    GI/Hepatic   Endo/Other    Renal/GU      Musculoskeletal   Abdominal   Peds  Hematology  (+) anemia ,   Anesthesia Other Findings       Reproductive/Obstetrics (+) Pregnancy                             Anesthesia Physical  Anesthesia Plan  ASA: II  Anesthesia Plan: Epidural   Post-op Pain Management:    Induction:   PONV Risk Score and Plan:   Airway Management Planned:   Additional Equipment:   Intra-op Plan:   Post-operative Plan:   Informed Consent: I have reviewed the patients History and Physical, chart, labs and discussed the procedure including the risks, benefits and alternatives for the proposed anesthesia with the patient or authorized representative who has indicated his/her understanding and acceptance.   Dental Advisory Given  Plan Discussed with:   Anesthesia Plan Comments:         Anesthesia Quick Evaluation

## 2017-08-31 NOTE — Anesthesia Procedure Notes (Signed)
Epidural Patient location during procedure: OB  Staffing Anesthesiologist: Jestin Burbach, MD Performed: anesthesiologist   Preanesthetic Checklist Completed: patient identified, pre-op evaluation, timeout performed, IV checked, risks and benefits discussed and monitors and equipment checked  Epidural Patient position: sitting Prep: site prepped and draped and DuraPrep Patient monitoring: heart rate, continuous pulse ox and blood pressure Approach: midline Location: L3-L4 Injection technique: LOR air and LOR saline  Needle:  Needle type: Tuohy  Needle gauge: 17 G Needle length: 9 cm Needle insertion depth: 6 cm Catheter type: closed end flexible Catheter size: 19 Gauge Catheter at skin depth: 11 cm Test dose: negative  Assessment Sensory level: T8 Events: blood not aspirated, injection not painful, no injection resistance, negative IV test and no paresthesia  Additional Notes Reason for block:procedure for pain     

## 2017-08-31 NOTE — Progress Notes (Signed)
SVD of vigorous female infant w/ apgars of 9,9.  Placenta delivered spontaneous w/ 3VC.   1st degree lac repaired w/ 3-0 vicryl rapide.  Fundus firm.  EBL 150cc .

## 2017-08-31 NOTE — Progress Notes (Signed)
Having occasional ctx.  Wants to wait for pitocin, requests AROM only for now  FHT cat 1 Toco - irreg Cvx 2.5/70/-2 AROM - clear  A/P;  Recheck cvx in 1hr - pitocin if not changing

## 2017-08-31 NOTE — Anesthesia Pain Management Evaluation Note (Signed)
  CRNA Pain Management Visit Note  Patient: Anita Wiggins, 32 y.o., female  "Hello I am a member of the anesthesia team at [facility]. We have an anesthesia team available at all times to provide care throughout the hospital, including epidural management and anesthesia for C-section. I don't know your plan for the delivery whether it a natural birth, water birth, IV sedation, nitrous supplementation, doula or epidural, but we want to meet your pain goals."   1.Was your pain managed to your expectations on prior hospitalizations?   Yes   2.What is your expectation for pain management during this hospitalization?     Epidural  3.How can we help you reach that goal? epidural  Record the patient's initial score and the patient's pain goal.   Pain: 0  Pain Goal: 6 [facility] wants you to be able to say your pain was always managed very well.  Ruqaya Strauss 08/31/2017

## 2017-08-31 NOTE — Progress Notes (Signed)
IV start attempted multiple times by 2 RN's. CRNA called.

## 2017-09-01 LAB — CBC
HCT: 32.1 % — ABNORMAL LOW (ref 36.0–46.0)
Hemoglobin: 10.7 g/dL — ABNORMAL LOW (ref 12.0–15.0)
MCH: 29.4 pg (ref 26.0–34.0)
MCHC: 33.3 g/dL (ref 30.0–36.0)
MCV: 88.2 fL (ref 78.0–100.0)
Platelets: 201 10*3/uL (ref 150–400)
RBC: 3.64 MIL/uL — ABNORMAL LOW (ref 3.87–5.11)
RDW: 17.9 % — ABNORMAL HIGH (ref 11.5–15.5)
WBC: 11.7 10*3/uL — ABNORMAL HIGH (ref 4.0–10.5)

## 2017-09-01 LAB — RPR: RPR Ser Ql: NONREACTIVE

## 2017-09-01 NOTE — Anesthesia Postprocedure Evaluation (Signed)
Anesthesia Post Note  Patient: Anita Wiggins  Procedure(s) Performed: AN AD HOC LABOR EPIDURAL     Patient location during evaluation: Mother Baby Anesthesia Type: Epidural Level of consciousness: awake, awake and alert and oriented Pain management: pain level controlled Vital Signs Assessment: post-procedure vital signs reviewed and stable Respiratory status: spontaneous breathing Cardiovascular status: blood pressure returned to baseline Postop Assessment: no headache, no backache, epidural receding, patient able to bend at knees, no apparent nausea or vomiting, adequate PO intake and able to ambulate Anesthetic complications: no    Last Vitals:  Vitals:   09/01/17 0505 09/01/17 0915  BP: 104/68 110/79  Pulse: 60 79  Resp:  16  Temp: 36.9 C 36.8 C  SpO2:  99%    Last Pain:  Vitals:   09/01/17 0915  TempSrc: Oral  PainSc: 1    Pain Goal: Patients Stated Pain Goal: 1 (09/01/17 0915)               Bufford Spikes

## 2017-09-01 NOTE — Plan of Care (Signed)
  Problem: Activity: Goal: Will verbalize the importance of balancing activity with adequate rest periods Outcome: Completed/Met  Pt ambulating in the room without difficulty.  Gradual increase of activity reviewed.  Pt verbalized understanding.

## 2017-09-01 NOTE — Lactation Note (Signed)
This note was copied from a baby's chart. Lactation Consultation Note  Patient Name: Anita Wiggins [id number] Date: 09/01/2017 Reason for consult: Initial assessment;Term  P3 mother whose infant is now 44 hours old.  Mother has limited breastfeeding experience.  Her first 2 children had lip ties.  Mother feels like this baby has already latched better.  Infant is sleepy and not showing any feeding cues, however, mother would like to try to latch him.  Her left nipple is already a little bit irritated with a small blister.  She wanted to try the cradle position even though I suggested other positions that may be more beneficial for a new, sleepy baby.  She wanted to latch him without my assistance.  He did not latch for her and I offered to help in the football hold to which she agreed.  Positioned her appropriately and tried to latch onto the left breast without success.  Baby remains too sleepy at the present time.  I reassured mother that this is normal and put baby STS.  I reviewed feeding cues with her and she will watch him for cues.    Discussed breast compression and hand expression after feedings.  Colostrum container provided for any EBM that she may obtain with hand expression.  Encouraged her to feed 8-12 times/24 hours or earlier if baby shows feeding cues.  Continue STS as much as possible. Mom made aware of O/P services, breastfeeding support groups, community resources, and our phone # for post-discharge questions. Mother has no further questions/concerns at this time.  She will call as needed for latch assistance.   Maternal Data Formula Feeding for Exclusion: No Has patient been taught Hand Expression?: Yes Does the patient have breastfeeding experience prior to this delivery?: Yes  Feeding Feeding Type: Breast Fed Length of feed: 5 min  LATCH Score Latch: Too sleepy or reluctant, no latch achieved, no sucking elicited.  Audible Swallowing: None  Type of Nipple: Everted at  rest and after stimulation  Comfort (Breast/Nipple): Filling, red/small blisters or bruises, mild/mod discomfort  Hold (Positioning): Assistance needed to correctly position infant at breast and maintain latch.  LATCH Score: 4  Interventions Interventions: Assisted with latch;Skin to skin;Breast massage;Hand express;Position options;Support pillows;Adjust position;Breast compression  Lactation Tools Discussed/Used WIC Program: No   Consult Status Consult Status: Follow-up Date: 09/02/17 Follow-up type: In-patient    Briana Newman R Yousaf Sainato 09/01/2017, 5:46 AM

## 2017-09-01 NOTE — Progress Notes (Signed)
Post Partum Day 1 Subjective: no complaints  Objective: Blood pressure 104/68, pulse 60, temperature 98.4 F (36.9 C), temperature source Oral, resp. rate 18, height 5\' 8"  (1.727 m), weight 167 lb 3.2 oz (75.8 kg), last menstrual period 11/17/2016, SpO2 100 %, unknown if currently breastfeeding.  Physical Exam:  General: alert Lochia: appropriate Uterine Fundus: firm Incision: healing well DVT Evaluation: No evidence of DVT seen on physical exam.  Recent Labs    08/31/17 0901 09/01/17 0617  HGB 12.3 10.7*  HCT 37.3 32.1*    Assessment/Plan: Plan for discharge tomorrow   LOS: 1 day   Margarette Asal 09/01/2017, 8:25 AM

## 2017-09-02 NOTE — Discharge Summary (Signed)
Obstetric Discharge Summary Reason for Admission: induction of labor Prenatal Procedures: none Intrapartum Procedures: spontaneous vaginal delivery Postpartum Procedures: none Complications-Operative and Postpartum: 1st degree perineal laceration Hemoglobin  Date Value Ref Range Status  09/01/2017 10.7 (L) 12.0 - 15.0 g/dL Final   HCT  Date Value Ref Range Status  09/01/2017 32.1 (L) 36.0 - 46.0 % Final    Physical Exam:  General: alert, cooperative, appears stated age and no distress Lochia: appropriate Uterine Fundus: firm Incision: healing well DVT Evaluation: No evidence of DVT seen on physical exam.  Discharge Diagnoses: Term Pregnancy-delivered  Discharge Information: Date: 09/02/2017 Activity: pelvic rest Diet: routine Medications: None Condition: stable Instructions: refer to practice specific booklet Discharge to: home   Newborn Data: Live born female  Birth Weight: 8 lb 3 oz (3715 g) APGAR: 9, 9  Newborn Delivery   Birth date/time:  08/31/2017 17:01:00 Delivery type:  Vaginal, Spontaneous     Home with mother  Circ before d/c.  Kynnedy Carreno C 09/02/2017, 9:05 AM

## 2017-09-02 NOTE — Lactation Note (Signed)
This note was copied from a baby's chart. Lactation Consultation Note  Patient Name: Anita Wiggins [id number] Date: 09/02/2017 Reason for consult: Follow-up assessment;Difficult latch;Nipple pain/trauma;Term  P3 mother whose infant is now 23 hours old.  Mother requesting latch assistance and comfort for sore nipples.  Mother had baby latched in cradle hold as I entered.  Reviewed what we had discussed last night about the cradle hold and it not being the best position for an infant.  Upon assessment, mother's breasts are full.  Her nipples are red and irritated with a positional stripe on the right nipple and a small blister on the left nipple.  Mother has been using comfort gels but RN has now given her coconut oil to use.  I suggested she continue to use the coconut oil after every feeding and to keep the nipple and areola well lubricated.  Upon oral assessment of baby, he is biting and has a tight suck.  His tongue is high in the upper palate.  I allowed him to do some suck training for about 5 minutes before trying to latch.  He does not easily open his mouth for a good latch.  After a couple of sucks mother stated it was painful.  Chin tug and relatching did not ease her pain.  I offered a NS and she agreed to try.  #20 NS applied to the right breast and latch attempted.  Infant fussy and pulling back.  He would not even suck on the NS.    Mother hand expressed 1 ml of colostrum and spoon fed this back to baby.  I suggested initiating a DEBP because he is still hungry and I would like to use her EBM for supplementation.  Mother willing to try.  Discussed pump set up, assembly of parts and cleaning of pump parts.  #24 flange size appropriate at this time.  Mother had to lower suction to 3 milk drops for comfort.  Advised her to feed any EBM she obtains back to baby.  She will call for assistance as needed.  RN updated.     Maternal Data Formula Feeding for Exclusion: No Has patient been taught  Hand Expression?: Yes  Feeding Feeding Type: Breast Fed Length of feed: 0 min  LATCH Score Latch: Too sleepy or reluctant, no latch achieved, no sucking elicited.  Audible Swallowing: None  Type of Nipple: Everted at rest and after stimulation(short shafted)  Comfort (Breast/Nipple): Filling, red/small blisters or bruises, mild/mod discomfort  Hold (Positioning): Assistance needed to correctly position infant at breast and maintain latch.  LATCH Score: 4  Interventions Interventions: Breast feeding basics reviewed;Assisted with latch;Skin to skin;Breast massage;Hand express;Position options;Support pillows;Adjust position;Breast compression;Coconut oil;Expressed milk;Comfort gels;DEBP  Lactation Tools Discussed/Used Tools: Pump;Flanges;Coconut oil;Nipple Shields Nipple shield size: 20 Flange Size: 24 Pump Review: Setup, frequency, and cleaning;Milk Storage Initiated by:: Anita Wiggins Date initiated:: 09/01/17   Consult Status Consult Status: Follow-up Date: 09/02/17 Follow-up type: In-patient    Shantinique Picazo R Orella Cushman 09/02/2017, 12:43 AM

## 2017-09-02 NOTE — Lactation Note (Signed)
This note was copied from a baby's chart. Lactation Consultation Note: Observed bilateral cracking with scabbing. Advised mother to phone OB and request APNO for her nipples.  Mother reports that infant is bitting. Assessed infants oral cavity with a gloved finger. Infant has a high palate and a thick posterior tongue tie that limit tongue mobility.  Assist mother with reverse pressure and firming her nipple and hand express piror to latching infant.  Infant placed in football hold and mother taught off sided latch. Infant latched with wide open mouth. Infant began rhythmic suckling and audible swallows. Mother reports discomfort only for a few seconds with the initial latch. Advised mother to unlatch infant if discomfort more than 10 seconds. Infant continuing to breastDiscussed S/S of Mastitis. Advised mother in treatment and prevention of engorgement. Lots of support and encouragement given . Mother is aware of available [facility] services , outpatient dept and [facility]. Mother receptive to all teaching.  Patient Name: Anita Wiggins [id number] Date: 09/02/2017 Reason for consult: Follow-up assessment   Maternal Data    Feeding Feeding Type: Breast Fed Length of feed: (infant continuing to breastfeed after I left the room)  LATCH Score Latch: Grasps breast easily, tongue down, lips flanged, rhythmical sucking.  Audible Swallowing: Spontaneous and intermittent  Type of Nipple: Everted at rest and after stimulation  Comfort (Breast/Nipple): Filling, red/small blisters or bruises, mild/mod discomfort  Hold (Positioning): Assistance needed to correctly position infant at breast and maintain latch.  LATCH Score: 8  Interventions    Lactation Tools Discussed/Used     Consult Status Consult Status: Complete    Darla Lesches 09/02/2017, 11:58 AM

## 2017-09-03 NOTE — Addendum Note (Signed)
Addendum  created 09/03/17 1006 by Nolon Nations, MD   Intraprocedure Meds edited

## 2017-09-03 NOTE — Telephone Encounter (Signed)
Preadmission screen  

## 2017-10-12 DIAGNOSIS — Z1389 Encounter for screening for other disorder: Secondary | ICD-10-CM | POA: Diagnosis not present

## 2018-09-28 DIAGNOSIS — J358 Other chronic diseases of tonsils and adenoids: Secondary | ICD-10-CM | POA: Diagnosis not present

## 2018-10-28 DIAGNOSIS — K219 Gastro-esophageal reflux disease without esophagitis: Secondary | ICD-10-CM | POA: Diagnosis not present

## 2018-11-08 DIAGNOSIS — J358 Other chronic diseases of tonsils and adenoids: Secondary | ICD-10-CM | POA: Diagnosis not present

## 2018-11-16 DIAGNOSIS — J358 Other chronic diseases of tonsils and adenoids: Secondary | ICD-10-CM | POA: Diagnosis not present

## 2018-11-16 DIAGNOSIS — Z0001 Encounter for general adult medical examination with abnormal findings: Secondary | ICD-10-CM | POA: Diagnosis not present

## 2018-11-16 DIAGNOSIS — E785 Hyperlipidemia, unspecified: Secondary | ICD-10-CM | POA: Diagnosis not present

## 2019-03-29 DIAGNOSIS — J3501 Chronic tonsillitis: Secondary | ICD-10-CM | POA: Diagnosis not present

## 2019-03-29 DIAGNOSIS — K219 Gastro-esophageal reflux disease without esophagitis: Secondary | ICD-10-CM | POA: Diagnosis not present

## 2019-05-24 DIAGNOSIS — R070 Pain in throat: Secondary | ICD-10-CM | POA: Diagnosis not present

## 2019-06-12 DIAGNOSIS — Z6821 Body mass index (BMI) 21.0-21.9, adult: Secondary | ICD-10-CM | POA: Diagnosis not present

## 2019-06-12 DIAGNOSIS — Z01419 Encounter for gynecological examination (general) (routine) without abnormal findings: Secondary | ICD-10-CM | POA: Diagnosis not present

## 2019-06-16 DIAGNOSIS — F410 Panic disorder [episodic paroxysmal anxiety] without agoraphobia: Secondary | ICD-10-CM | POA: Diagnosis not present

## 2019-09-12 DIAGNOSIS — D2272 Melanocytic nevi of left lower limb, including hip: Secondary | ICD-10-CM | POA: Diagnosis not present

## 2019-09-12 DIAGNOSIS — D225 Melanocytic nevi of trunk: Secondary | ICD-10-CM | POA: Diagnosis not present

## 2019-09-12 DIAGNOSIS — Z1283 Encounter for screening for malignant neoplasm of skin: Secondary | ICD-10-CM | POA: Diagnosis not present

## 2019-09-12 DIAGNOSIS — D485 Neoplasm of uncertain behavior of skin: Secondary | ICD-10-CM | POA: Diagnosis not present

## 2019-09-14 DIAGNOSIS — J358 Other chronic diseases of tonsils and adenoids: Secondary | ICD-10-CM | POA: Diagnosis not present

## 2019-09-14 DIAGNOSIS — F410 Panic disorder [episodic paroxysmal anxiety] without agoraphobia: Secondary | ICD-10-CM | POA: Diagnosis not present

## 2019-09-14 DIAGNOSIS — E785 Hyperlipidemia, unspecified: Secondary | ICD-10-CM | POA: Diagnosis not present

## 2019-09-14 DIAGNOSIS — R070 Pain in throat: Secondary | ICD-10-CM | POA: Diagnosis not present

## 2020-07-09 DIAGNOSIS — Z6821 Body mass index (BMI) 21.0-21.9, adult: Secondary | ICD-10-CM | POA: Diagnosis not present

## 2020-07-09 DIAGNOSIS — Z01419 Encounter for gynecological examination (general) (routine) without abnormal findings: Secondary | ICD-10-CM | POA: Diagnosis not present

## 2020-07-09 DIAGNOSIS — Z8042 Family history of malignant neoplasm of prostate: Secondary | ICD-10-CM | POA: Diagnosis not present

## 2020-08-22 DIAGNOSIS — Z809 Family history of malignant neoplasm, unspecified: Secondary | ICD-10-CM | POA: Diagnosis not present

## 2021-04-16 DIAGNOSIS — Z1283 Encounter for screening for malignant neoplasm of skin: Secondary | ICD-10-CM | POA: Diagnosis not present

## 2021-04-16 DIAGNOSIS — D225 Melanocytic nevi of trunk: Secondary | ICD-10-CM | POA: Diagnosis not present

## 2021-04-16 DIAGNOSIS — L700 Acne vulgaris: Secondary | ICD-10-CM | POA: Diagnosis not present

## 2021-05-11 ENCOUNTER — Other Ambulatory Visit: Payer: Self-pay

## 2021-05-11 ENCOUNTER — Ambulatory Visit
Admission: RE | Admit: 2021-05-11 | Discharge: 2021-05-11 | Disposition: A | Discharge status: Home / Self Care | Payer: BLUE CROSS/BLUE SHIELD | Source: Ambulatory Visit | Attending: Urgent Care | Admitting: Urgent Care

## 2021-05-11 VITALS — BP 133/79 | HR 93 | Temp 98.2°F | Resp 16

## 2021-05-11 DIAGNOSIS — H9203 Otalgia, bilateral: Secondary | ICD-10-CM | POA: Diagnosis not present

## 2021-05-11 DIAGNOSIS — H6993 Unspecified Eustachian tube disorder, bilateral: Secondary | ICD-10-CM

## 2021-05-11 DIAGNOSIS — H6983 Other specified disorders of Eustachian tube, bilateral: Secondary | ICD-10-CM | POA: Diagnosis not present

## 2021-05-11 MED ORDER — AMOXICILLIN 875 MG PO TABS: 875.0000 mg | ORAL_TABLET | Freq: Two times a day (BID) | ORAL | 0 refills | Status: AC

## 2021-05-11 MED ORDER — FLUTICASONE PROPIONATE 50 MCG/ACT NA SUSP: 2.0000 | Freq: Every day | NASAL | 12 refills | Status: AC

## 2021-05-11 MED ORDER — PSEUDOEPHEDRINE HCL 60 MG PO TABS: 60.0000 mg | ORAL_TABLET | Freq: Three times a day (TID) | ORAL | 0 refills | Status: AC | PRN

## 2021-05-11 MED ORDER — LEVOCETIRIZINE DIHYDROCHLORIDE 5 MG PO TABS: 5.0000 mg | ORAL_TABLET | Freq: Every evening | ORAL | 0 refills | Status: AC

## 2021-05-11 NOTE — ED Triage Notes (Signed)
Pt reports bilateral ear pain x 3 days. Pain is worse in the left ear.

## 2021-05-11 NOTE — ED Provider Notes (Signed)
[facility] Anita Wiggins   MRN: [id number] DOB: 18-Oct-1985  Subjective:   Anita Wiggins is a 36 y.o. female presenting for 3-day history of acute onset bilateral ear pain, worse over the left side.  Denies history of ear issues.  No history of allergic rhinitis.  No runny or stuffy nose, sore throat, cough.  No tinnitus, ear drainage.  No current facility-administered medications for this encounter.  Current Outpatient Medications:    ferrous sulfate 325 (65 FE) MG tablet, Take 325 mg by mouth daily with breakfast., Disp: , Rfl:    Prenatal Vit-Fe Fumarate-FA (PRENATAL MULTIVITAMIN) TABS tablet, Take 1 tablet by mouth daily at 12 noon., Disp: , Rfl:    ranitidine (ZANTAC) 150 MG tablet, Take 150 mg by mouth at bedtime., Disp: , Rfl:    No Known Allergies  Past Medical History:  Diagnosis Date   Anemia    Hx of varicella    No pertinent past medical history    Shingles      Past Surgical History:  Procedure Laterality Date   DIAGNOSTIC LAPAROSCOPY WITH REMOVAL OF ECTOPIC PREGNANCY Right 07/21/2013   Procedure: DIAGNOSTIC LAPAROSCOPY WITH REMOVAL OF ECTOPIC PREGNANCY;  Surgeon: Linda Hedges, DO;  Location: [facility];  Service: Gynecology;  Laterality: Right;   NO PAST SURGERIES     UMBILICAL HERNIA REPAIR      Family History  Problem Relation Age of Onset   Heart disease Maternal Grandfather    Congestive Heart Failure Maternal Grandfather    Cancer Maternal Grandfather        lymphoma   Diabetes Paternal Uncle     Social History   Tobacco Use   Smoking status: Never   Smokeless tobacco: Never  Substance Use Topics   Alcohol use: No   Drug use: Never    ROS   Objective:   Vitals: BP 133/79 (BP Location: Right Arm)    Pulse 93    Temp 98.2 F (36.8 C) (Oral)    Resp 16    SpO2 99%    Breastfeeding No   Physical Exam Constitutional:      General: She is not in acute distress.    Appearance: Normal appearance. She is well-developed and normal weight. She  is not ill-appearing, toxic-appearing or diaphoretic.  HENT:     Head: Normocephalic and atraumatic.     Right Ear: Tympanic membrane, ear canal and external ear normal. No drainage or tenderness. No middle ear effusion. There is no impacted cerumen. Tympanic membrane is not erythematous.     Left Ear: Tympanic membrane, ear canal and external ear normal. No drainage or tenderness.  No middle ear effusion. There is no impacted cerumen. Tympanic membrane is not erythematous.     Nose: No congestion or rhinorrhea.     Mouth/Throat:     Mouth: Mucous membranes are moist. No oral lesions.     Pharynx: No pharyngeal swelling, oropharyngeal exudate, posterior oropharyngeal erythema or uvula swelling.     Tonsils: No tonsillar exudate or tonsillar abscesses.  Eyes:     General: No scleral icterus.       Right eye: No discharge.        Left eye: No discharge.     Extraocular Movements: Extraocular movements intact.     Right eye: Normal extraocular motion.     Left eye: Normal extraocular motion.     Conjunctiva/sclera: Conjunctivae normal.  Cardiovascular:     Rate and Rhythm: Normal rate.  Pulmonary:  Effort: Pulmonary effort is normal.  Musculoskeletal:     Cervical back: Normal range of motion and neck supple.  Lymphadenopathy:     Cervical: No cervical adenopathy.  Skin:    General: Skin is warm and dry.  Neurological:     General: No focal deficit present.     Mental Status: She is alert and oriented to person, place, and time.  Psychiatric:        Mood and Affect: Mood normal.        Behavior: Behavior normal.    Assessment and Plan :   PDMP not reviewed this encounter.  1. Eustachian tube dysfunction, bilateral   2. Acute ear pain, bilateral    Unremarkable ENT exam.  Will use conservative management for what I suspect is eustachian tube dysfunction.  Recommended starting Flonase, Xyzal, Sudafed.  I did offer patient a printed prescription of amoxicillin in the event  that she has no improvement or her left ear pain worsens in the next 3 days, she can fill this to cover for an otitis media.  No signs of otitis externa.  Follow-up with ENT thereafter.  Counseled patient on potential for adverse effects with medications prescribed/recommended today, ER and return-to-clinic precautions discussed, patient verbalized understanding.    [name], PA-C 05/11/21 1233

## 2021-05-30 DIAGNOSIS — M5136 Other intervertebral disc degeneration, lumbar region: Secondary | ICD-10-CM | POA: Diagnosis not present

## 2021-05-30 DIAGNOSIS — M542 Cervicalgia: Secondary | ICD-10-CM | POA: Diagnosis not present

## 2021-05-30 DIAGNOSIS — M9901 Segmental and somatic dysfunction of cervical region: Secondary | ICD-10-CM | POA: Diagnosis not present

## 2021-05-30 DIAGNOSIS — M9903 Segmental and somatic dysfunction of lumbar region: Secondary | ICD-10-CM | POA: Diagnosis not present

## 2021-06-02 DIAGNOSIS — M9901 Segmental and somatic dysfunction of cervical region: Secondary | ICD-10-CM | POA: Diagnosis not present

## 2021-06-02 DIAGNOSIS — M9903 Segmental and somatic dysfunction of lumbar region: Secondary | ICD-10-CM | POA: Diagnosis not present

## 2021-06-02 DIAGNOSIS — M542 Cervicalgia: Secondary | ICD-10-CM | POA: Diagnosis not present

## 2021-06-02 DIAGNOSIS — M5136 Other intervertebral disc degeneration, lumbar region: Secondary | ICD-10-CM | POA: Diagnosis not present

## 2021-06-03 DIAGNOSIS — M9903 Segmental and somatic dysfunction of lumbar region: Secondary | ICD-10-CM | POA: Diagnosis not present

## 2021-06-03 DIAGNOSIS — M542 Cervicalgia: Secondary | ICD-10-CM | POA: Diagnosis not present

## 2021-06-03 DIAGNOSIS — M5136 Other intervertebral disc degeneration, lumbar region: Secondary | ICD-10-CM | POA: Diagnosis not present

## 2021-06-03 DIAGNOSIS — M9901 Segmental and somatic dysfunction of cervical region: Secondary | ICD-10-CM | POA: Diagnosis not present

## 2021-06-05 DIAGNOSIS — M9903 Segmental and somatic dysfunction of lumbar region: Secondary | ICD-10-CM | POA: Diagnosis not present

## 2021-06-05 DIAGNOSIS — M9901 Segmental and somatic dysfunction of cervical region: Secondary | ICD-10-CM | POA: Diagnosis not present

## 2021-06-05 DIAGNOSIS — M542 Cervicalgia: Secondary | ICD-10-CM | POA: Diagnosis not present

## 2021-06-05 DIAGNOSIS — M5136 Other intervertebral disc degeneration, lumbar region: Secondary | ICD-10-CM | POA: Diagnosis not present

## 2021-06-09 DIAGNOSIS — M9903 Segmental and somatic dysfunction of lumbar region: Secondary | ICD-10-CM | POA: Diagnosis not present

## 2021-06-09 DIAGNOSIS — M5136 Other intervertebral disc degeneration, lumbar region: Secondary | ICD-10-CM | POA: Diagnosis not present

## 2021-06-09 DIAGNOSIS — M9901 Segmental and somatic dysfunction of cervical region: Secondary | ICD-10-CM | POA: Diagnosis not present

## 2021-06-09 DIAGNOSIS — M542 Cervicalgia: Secondary | ICD-10-CM | POA: Diagnosis not present

## 2021-06-11 DIAGNOSIS — M5136 Other intervertebral disc degeneration, lumbar region: Secondary | ICD-10-CM | POA: Diagnosis not present

## 2021-06-11 DIAGNOSIS — M9903 Segmental and somatic dysfunction of lumbar region: Secondary | ICD-10-CM | POA: Diagnosis not present

## 2021-06-11 DIAGNOSIS — M542 Cervicalgia: Secondary | ICD-10-CM | POA: Diagnosis not present

## 2021-06-11 DIAGNOSIS — M9901 Segmental and somatic dysfunction of cervical region: Secondary | ICD-10-CM | POA: Diagnosis not present

## 2021-06-13 DIAGNOSIS — M542 Cervicalgia: Secondary | ICD-10-CM | POA: Diagnosis not present

## 2021-06-13 DIAGNOSIS — M9903 Segmental and somatic dysfunction of lumbar region: Secondary | ICD-10-CM | POA: Diagnosis not present

## 2021-06-13 DIAGNOSIS — M5136 Other intervertebral disc degeneration, lumbar region: Secondary | ICD-10-CM | POA: Diagnosis not present

## 2021-06-13 DIAGNOSIS — M9901 Segmental and somatic dysfunction of cervical region: Secondary | ICD-10-CM | POA: Diagnosis not present

## 2021-06-17 DIAGNOSIS — M542 Cervicalgia: Secondary | ICD-10-CM | POA: Diagnosis not present

## 2021-06-17 DIAGNOSIS — M9901 Segmental and somatic dysfunction of cervical region: Secondary | ICD-10-CM | POA: Diagnosis not present

## 2021-06-17 DIAGNOSIS — M5136 Other intervertebral disc degeneration, lumbar region: Secondary | ICD-10-CM | POA: Diagnosis not present

## 2021-06-17 DIAGNOSIS — M9903 Segmental and somatic dysfunction of lumbar region: Secondary | ICD-10-CM | POA: Diagnosis not present

## 2021-06-19 DIAGNOSIS — M9901 Segmental and somatic dysfunction of cervical region: Secondary | ICD-10-CM | POA: Diagnosis not present

## 2021-06-19 DIAGNOSIS — M542 Cervicalgia: Secondary | ICD-10-CM | POA: Diagnosis not present

## 2021-06-19 DIAGNOSIS — M5136 Other intervertebral disc degeneration, lumbar region: Secondary | ICD-10-CM | POA: Diagnosis not present

## 2021-06-19 DIAGNOSIS — M9903 Segmental and somatic dysfunction of lumbar region: Secondary | ICD-10-CM | POA: Diagnosis not present

## 2021-06-20 DIAGNOSIS — M5136 Other intervertebral disc degeneration, lumbar region: Secondary | ICD-10-CM | POA: Diagnosis not present

## 2021-06-20 DIAGNOSIS — M9901 Segmental and somatic dysfunction of cervical region: Secondary | ICD-10-CM | POA: Diagnosis not present

## 2021-06-20 DIAGNOSIS — M542 Cervicalgia: Secondary | ICD-10-CM | POA: Diagnosis not present

## 2021-06-20 DIAGNOSIS — M9903 Segmental and somatic dysfunction of lumbar region: Secondary | ICD-10-CM | POA: Diagnosis not present

## 2021-06-24 DIAGNOSIS — M542 Cervicalgia: Secondary | ICD-10-CM | POA: Diagnosis not present

## 2021-06-24 DIAGNOSIS — M5136 Other intervertebral disc degeneration, lumbar region: Secondary | ICD-10-CM | POA: Diagnosis not present

## 2021-06-24 DIAGNOSIS — M9903 Segmental and somatic dysfunction of lumbar region: Secondary | ICD-10-CM | POA: Diagnosis not present

## 2021-06-24 DIAGNOSIS — M9901 Segmental and somatic dysfunction of cervical region: Secondary | ICD-10-CM | POA: Diagnosis not present

## 2021-06-26 DIAGNOSIS — M542 Cervicalgia: Secondary | ICD-10-CM | POA: Diagnosis not present

## 2021-06-26 DIAGNOSIS — M5136 Other intervertebral disc degeneration, lumbar region: Secondary | ICD-10-CM | POA: Diagnosis not present

## 2021-06-26 DIAGNOSIS — M9903 Segmental and somatic dysfunction of lumbar region: Secondary | ICD-10-CM | POA: Diagnosis not present

## 2021-06-26 DIAGNOSIS — M9901 Segmental and somatic dysfunction of cervical region: Secondary | ICD-10-CM | POA: Diagnosis not present

## 2021-06-27 DIAGNOSIS — M5136 Other intervertebral disc degeneration, lumbar region: Secondary | ICD-10-CM | POA: Diagnosis not present

## 2021-06-27 DIAGNOSIS — M542 Cervicalgia: Secondary | ICD-10-CM | POA: Diagnosis not present

## 2021-06-27 DIAGNOSIS — M9903 Segmental and somatic dysfunction of lumbar region: Secondary | ICD-10-CM | POA: Diagnosis not present

## 2021-06-27 DIAGNOSIS — M9901 Segmental and somatic dysfunction of cervical region: Secondary | ICD-10-CM | POA: Diagnosis not present

## 2021-07-10 DIAGNOSIS — Z124 Encounter for screening for malignant neoplasm of cervix: Secondary | ICD-10-CM | POA: Diagnosis not present

## 2021-07-10 DIAGNOSIS — Z01419 Encounter for gynecological examination (general) (routine) without abnormal findings: Secondary | ICD-10-CM | POA: Diagnosis not present

## 2021-07-10 DIAGNOSIS — Z309 Encounter for contraceptive management, unspecified: Secondary | ICD-10-CM | POA: Diagnosis not present

## 2021-07-10 DIAGNOSIS — Z6821 Body mass index (BMI) 21.0-21.9, adult: Secondary | ICD-10-CM | POA: Diagnosis not present

## 2023-01-25 ENCOUNTER — Encounter (HOSPITAL_COMMUNITY): Payer: Self-pay

## 2023-01-25 ENCOUNTER — Emergency Department (HOSPITAL_COMMUNITY)
Admission: EM | Admit: 2023-01-25 | Discharge: 2023-01-25 | Disposition: A | Discharge status: Home / Self Care | Payer: BC Managed Care – PPO | Attending: Emergency Medicine | Admitting: Emergency Medicine

## 2023-01-25 ENCOUNTER — Emergency Department (HOSPITAL_COMMUNITY): Payer: BC Managed Care – PPO

## 2023-01-25 ENCOUNTER — Other Ambulatory Visit: Payer: Self-pay

## 2023-01-25 DIAGNOSIS — N12 Tubulo-interstitial nephritis, not specified as acute or chronic: Secondary | ICD-10-CM | POA: Diagnosis not present

## 2023-01-25 DIAGNOSIS — R109 Unspecified abdominal pain: Secondary | ICD-10-CM | POA: Diagnosis present

## 2023-01-25 DIAGNOSIS — D72829 Elevated white blood cell count, unspecified: Secondary | ICD-10-CM | POA: Insufficient documentation

## 2023-01-25 LAB — URINALYSIS, ROUTINE W REFLEX MICROSCOPIC
Bacteria, UA: NONE SEEN
Bilirubin Urine: NEGATIVE
Glucose, UA: NEGATIVE mg/dL
Ketones, ur: 5 mg/dL — AB
Leukocytes,Ua: NEGATIVE
Nitrite: NEGATIVE
Protein, ur: NEGATIVE mg/dL
Specific Gravity, Urine: 1.012 (ref 1.005–1.030)
pH: 6 (ref 5.0–8.0)

## 2023-01-25 LAB — COMPREHENSIVE METABOLIC PANEL
ALT: 13 U/L (ref 0–44)
AST: 15 U/L (ref 15–41)
Albumin: 4.1 g/dL (ref 3.5–5.0)
Alkaline Phosphatase: 36 U/L — ABNORMAL LOW (ref 38–126)
Anion gap: 13 (ref 5–15)
BUN: 8 mg/dL (ref 6–20)
CO2: 22 mmol/L (ref 22–32)
Calcium: 9.3 mg/dL (ref 8.9–10.3)
Chloride: 101 mmol/L (ref 98–111)
Creatinine, Ser: 0.73 mg/dL (ref 0.44–1.00)
GFR, Estimated: >60 mL/min (ref >60)
Glucose, Bld: 102 mg/dL — ABNORMAL HIGH (ref 70–99)
Potassium: 3.8 mmol/L (ref 3.5–5.1)
Sodium: 136 mmol/L (ref 135–145)
Total Bilirubin: 0.8 mg/dL (ref <1.2)
Total Protein: 7.2 g/dL (ref 6.5–8.1)

## 2023-01-25 LAB — CBC WITH DIFFERENTIAL/PLATELET
Abs Immature Granulocytes: 0.04 10*3/uL (ref 0.00–0.07)
Basophils Absolute: 0.1 10*3/uL (ref 0.0–0.1)
Basophils Relative: 1 %
Eosinophils Absolute: 0.2 10*3/uL (ref 0.0–0.5)
Eosinophils Relative: 2 %
HCT: 41.7 % (ref 36.0–46.0)
Hemoglobin: 13.9 g/dL (ref 12.0–15.0)
Immature Granulocytes: 0 %
Lymphocytes Relative: 15 %
Lymphs Abs: 1.6 10*3/uL (ref 0.7–4.0)
MCH: 30.1 pg (ref 26.0–34.0)
MCHC: 33.3 g/dL (ref 30.0–36.0)
MCV: 90.3 fL (ref 80.0–100.0)
Monocytes Absolute: 0.7 10*3/uL (ref 0.1–1.0)
Monocytes Relative: 6 %
Neutro Abs: 8.3 10*3/uL — ABNORMAL HIGH (ref 1.7–7.7)
Neutrophils Relative %: 76 %
Platelets: 339 10*3/uL (ref 150–400)
RBC: 4.62 MIL/uL (ref 3.87–5.11)
RDW: 12.8 % (ref 11.5–15.5)
WBC: 11 10*3/uL — ABNORMAL HIGH (ref 4.0–10.5)
nRBC: 0 % (ref 0.0–0.2)

## 2023-01-25 LAB — POC URINE PREG, ED: Preg Test, Ur: NEGATIVE

## 2023-01-25 LAB — LIPASE, BLOOD: Lipase: 26 U/L (ref 11–51)

## 2023-01-25 MED ORDER — CEPHALEXIN 500 MG PO CAPS: 500.0000 mg | ORAL_CAPSULE | Freq: Four times a day (QID) | ORAL | 0 refills | Status: AC

## 2023-01-25 MED ORDER — ONDANSETRON HCL 4 MG PO TABS: 4.0000 mg | ORAL_TABLET | Freq: Three times a day (TID) | ORAL | 0 refills | Status: AC | PRN

## 2023-01-25 MED ORDER — SODIUM CHLORIDE 0.9 % IV SOLN
1.0000 g | Freq: Once | INTRAVENOUS | Status: AC
  Administered 2023-01-25: 1 g via INTRAVENOUS
  Filled 2023-01-25: qty 10

## 2023-01-25 MED ORDER — ONDANSETRON HCL 4 MG/2ML IJ SOLN
4.0000 mg | Freq: Once | INTRAMUSCULAR | Status: AC
  Administered 2023-01-25: 4 mg via INTRAVENOUS
  Filled 2023-01-25: qty 2

## 2023-01-25 MED ORDER — CEPHALEXIN 500 MG PO CAPS: 500.0000 mg | ORAL_CAPSULE | Freq: Four times a day (QID) | ORAL | 0 refills | Status: DC

## 2023-01-25 MED ORDER — HYDROMORPHONE HCL 1 MG/ML IJ SOLN
0.5000 mg | Freq: Once | INTRAMUSCULAR | Status: DC
  Filled 2023-01-25: qty 0.5

## 2023-01-25 MED ORDER — IOHEXOL 300 MG/ML  SOLN
100.0000 mL | Freq: Once | INTRAMUSCULAR | Status: AC | PRN
  Administered 2023-01-25: 75 mL via INTRAVENOUS

## 2023-01-25 MED ORDER — IBUPROFEN 600 MG PO TABS: 600.0000 mg | ORAL_TABLET | Freq: Four times a day (QID) | ORAL | 0 refills | Status: AC | PRN

## 2023-01-25 NOTE — ED Triage Notes (Signed)
Pt to er, states that last night she was having some R flank pain, states that the pain has no moved to the front, states that they went to urgent care and everything was ok and sent her here for further evaluation.

## 2023-01-25 NOTE — ED Provider Notes (Signed)
Anita Wiggins Provider Note   CSN: [id number] Arrival date & time: 01/25/23  1252     History  Chief Complaint  Patient presents with   Abdominal Pain    Anita Wiggins is a 37 y.o. female.  Patient to ED with abdominal pain that started last night with mild periumbilical pain. This morning the pain worsened and extended intermittently to the right flank. She reports urinary frequency and dysuria, associated nausea with vomiting. No fever. Normal bowel movement this morning that did not change the character of the pain.   The history is provided by the patient. No language interpreter was used.  Abdominal Pain      Home Medications Prior to Admission medications   Medication Sig Start Date End Date Taking? Authorizing Provider  ibuprofen (ADVIL) 600 MG tablet Take 1 tablet (600 mg total) by mouth every 6 (six) hours as needed. 01/25/23  Yes Agustina Witzke, PA-C  ondansetron (ZOFRAN) 4 MG tablet Take 1 tablet (4 mg total) by mouth every 8 (eight) hours as needed. 01/25/23  Yes Melvin Whiteford, Melvenia Beam, PA-C  amoxicillin (AMOXIL) 875 MG tablet Take 1 tablet (875 mg total) by mouth 2 (two) times daily. Do not fill prior to 05/13/2021. 05/11/21   Wallis Bamberg, PA-C  cephALEXin (KEFLEX) 500 MG capsule Take 1 capsule (500 mg total) by mouth 4 (four) times daily. 01/25/23   Elpidio Anis, PA-C  ferrous sulfate 325 (65 FE) MG tablet Take 325 mg by mouth daily with breakfast.    [provider]  fluticasone (FLONASE) 50 MCG/ACT nasal spray Place 2 sprays into both nostrils daily. 05/11/21   Wallis Bamberg, PA-C  levocetirizine (XYZAL) 5 MG tablet Take 1 tablet (5 mg total) by mouth every evening. 05/11/21   Wallis Bamberg, PA-C  Prenatal Vit-Fe Fumarate-FA (PRENATAL MULTIVITAMIN) TABS tablet Take 1 tablet by mouth daily at 12 noon.    [provider]  pseudoephedrine (SUDAFED) 60 MG tablet Take 1 tablet (60 mg total) by mouth every 8 (eight) hours as  needed for congestion. 05/11/21   Wallis Bamberg, PA-C  ranitidine (ZANTAC) 150 MG tablet Take 150 mg by mouth at bedtime.    [provider]      Allergies    Patient has no known allergies.    Review of Systems   Review of Systems  Gastrointestinal:  Positive for abdominal pain.    Physical Exam Updated Vital Signs BP 122/76   Pulse 73   Temp 98.5 F (36.9 C) (Oral)   Resp 20   Ht 5\' 9"  (1.753 m)   Wt 65.3 kg   SpO2 96%   BMI 21.27 kg/m  Physical Exam Vitals and nursing note reviewed.  Constitutional:      General: She is not in acute distress.    Appearance: She is well-developed. She is ill-appearing. She is not toxic-appearing.  Cardiovascular:     Rate and Rhythm: Normal rate.  Pulmonary:     Effort: Pulmonary effort is normal.  Abdominal:     General: Bowel sounds are normal.     Palpations: Abdomen is soft.     Tenderness: There is abdominal tenderness in the right lower quadrant. There is guarding.  Neurological:     Mental Status: She is alert.     ED Results / Procedures / Treatments   Labs (all labs ordered are listed, but only abnormal results are displayed) Labs Reviewed  CBC WITH DIFFERENTIAL/PLATELET - Abnormal; Notable for the  following components:      Result Value   WBC 11.0 (*)    Neutro Abs 8.3 (*)    All other components within normal limits  COMPREHENSIVE METABOLIC PANEL - Abnormal; Notable for the following components:   Glucose, Bld 102 (*)    Alkaline Phosphatase 36 (*)    All other components within normal limits  URINALYSIS, ROUTINE W REFLEX MICROSCOPIC - Abnormal; Notable for the following components:   Hgb urine dipstick MODERATE (*)    Ketones, ur 5 (*)    All other components within normal limits  LIPASE, BLOOD  POC URINE PREG, ED    EKG None  Radiology CT ABDOMEN PELVIS W CONTRAST  Result Date: 01/25/2023 CLINICAL DATA:  Right lower quadrant pain for 2 days. EXAM: CT ABDOMEN AND PELVIS WITH CONTRAST TECHNIQUE:  Multidetector CT imaging of the abdomen and pelvis was performed using the standard protocol following bolus administration of intravenous contrast. RADIATION DOSE REDUCTION: This exam was performed according to the departmental dose-optimization program which includes automated exposure control, adjustment of the mA and/or kV according to patient size and/or use of iterative reconstruction technique. CONTRAST:  75mL OMNIPAQUE IOHEXOL 300 MG/ML  SOLN COMPARISON:  None Available. FINDINGS: Lower chest: The lung bases are clear. Hepatobiliary: No focal liver abnormality is seen. No gallstones, gallbladder wall thickening, or biliary dilatation. Pancreas: Unremarkable. No pancreatic ductal dilatation or surrounding inflammatory changes. Spleen: Normal in size without focal abnormality. Adrenals/Urinary Tract: No adrenal nodule. There is moderate right hydroureteronephrosis. The ureter is dilated to the midportion. No evidence of renal or ureteral stones. There is mild right ureteral enhancement. Mild right perinephric edema and heterogeneous enhancement. Slight fullness of the left renal collecting system but no ureteral enhancement. Partially distended urinary bladder, no bladder wall thickening Stomach/Bowel: No bowel obstruction or inflammation. Normal appendix. Decompressed stomach. Please note detailed bowel assessment is limited in the absence of enteric contrast. Moderate volume of colonic stool with mild colonic redundancy. Vascular/Lymphatic: Normal caliber abdominal aorta. Patent portal vein. No acute vascular findings. No enlarged lymph nodes in the abdomen or pelvis. Reproductive: Soft tissue prominence in the region of the cervix. The endometrium may be thickened, 2.3 cm when measured by CT. No suspicious adnexal mass. Suspected tampon in place. Other: Trace free fluid in the pelvis. No free air. No focal fluid collection. Small fat containing umbilical hernia. Musculoskeletal: There are no acute or  suspicious osseous abnormalities. Occasional bone islands in the pelvis. IMPRESSION: 1. Moderate right hydroureteronephrosis with mild right ureteral enhancement and perinephric edema. No evidence of renal or ureteral stones. Findings are suspicious for ascending urinary tract infection. Recommend correlation with urinalysis. 2. Soft tissue prominence in the region of the cervix. The endometrium may be thickened, 2.3 cm when measured by CT. Recommend correlation with physical exam, up-to-date Pap smear recommended. 3. Normal appendix. Electronically Signed   By: Narda Rutherford M.D.   On: 01/25/2023 18:03    Procedures Procedures    Medications Ordered in ED Medications  HYDROmorphone (DILAUDID) injection 0.5 mg (0 mg Intravenous Hold 01/25/23 1347)  ondansetron (ZOFRAN) injection 4 mg (4 mg Intravenous Given 01/25/23 1346)  iohexol (OMNIPAQUE) 300 MG/ML solution 100 mL (75 mLs Intravenous Contrast Given 01/25/23 1601)  cefTRIAXone (ROCEPHIN) 1 g in sodium chloride 0.9 % 100 mL IVPB (0 g Intravenous Stopped 01/25/23 1835)    ED Course/ Medical Decision Making/ A&P Clinical Course as of 01/25/23 2118  Mon Jan 25, 2023  1830 Patient to ED with pain  in the periumbilical abdomen, moved to the right lateral and RLQ through the day, worse when walking or sitting up. No fever. Reports urinary symptoms and N/V. Labs reported and show normal electrolytes, normal renal function. Mild leukocytosis of 11.0. UA showing no bacteria, WBC 0-5, moderate blood. CT ordered to evaluate for appendicitis as no clear cause established for RLQ abdominal pain. CT results show moderate right hydroureteronephrosis with mild right ureteral enhancement and perinephric stranding. C/w pyelonephritis. IV Rocephin given in ED.   No fever while in the department. VSS. Offered pain medication but declined. Zofran with improvement of nausea. She is felt stable for discharge home with strict return precautions. CT also showing soft  tissue prominence in the region of the cervix and endometrium may be thickened with GYN f/u recommended. This was discussed with the patient who has GYN available for further evaluation and will make an appointment.  [SU]    Clinical Course User Index [SU] Elpidio Anis, PA-C                                 Medical Decision Making Amount and/or Complexity of Data Reviewed Radiology: ordered.  Risk Prescription drug management.           Final Clinical Impression(s) / ED Diagnoses Final diagnoses:  Pyelonephritis    Rx / DC Orders ED Discharge Orders          Ordered    cephALEXin (KEFLEX) 500 MG capsule  4 times daily,   Status:  Discontinued        01/25/23 1815    ibuprofen (ADVIL) 600 MG tablet  Every 6 hours PRN        01/25/23 1815    ondansetron (ZOFRAN) 4 MG tablet  Every 8 hours PRN        01/25/23 1815    cephALEXin (KEFLEX) 500 MG capsule  4 times daily        01/25/23 1816              Elpidio Anis, PA-C 01/25/23 2118    Cathren Laine, MD 01/28/23 [phone]

## 2023-01-25 NOTE — Discharge Instructions (Addendum)
Take the antibiotic (Keflex) as directed for 10 days. Zofran for nausea as needed and ibuprofen for pain every 6 hours as needed.   Follow up with your doctor if no better in 3-4 days. Return to the ED with any high fever, severe pain, uncontrolled vomiting or other concern.   As discussed, gynecologic follow up is recommended for CT finding of abnormal appearance of the soft tissue of the cervix and possible endometrial thickening. These are nonspecific findings but GYN evaluation is recommende.

## 2023-08-31 ENCOUNTER — Encounter (HOSPITAL_COMMUNITY): Payer: Self-pay
